# Patient Record
Sex: Female | Born: 1985 | Race: Black or African American | Hispanic: No | Marital: Single
Health system: Midwestern US, Community
[De-identification: ages and names within clinical notes are randomized; demographics above are authoritative.]

## PROBLEM LIST (undated history)

## (undated) DIAGNOSIS — E119 Type 2 diabetes mellitus without complications: Secondary | ICD-10-CM

---

## 2006-07-31 NOTE — ED Provider Notes (Signed)
[facility]                      EMERGENCY DEPARTMENT TREATMENT REPORT   NAME:  [name] [name], [name] [name]         PT. LOCATION:    ER  ER58       DOB:  [birth year]                                                                     AGE:  21   MR #:       BILLING #:           DOS: 07/31/2006  TIME:          SEX:  F   62-76-71    [id number]   cc:    Harless Nakayama, M.D.   Primary Physician:   CHIEF COMPLAINT:  This is a 21 year old female with a history of vaginal   bleeding and low abdominal cramping over the past several days.   HISTORY OF PRESENT ILLNESS:  This is a 21 year old female who states that   she was told last Wednesday that she had triplets and was approximately   [redacted] weeks pregnant.  She came back up here to stay with her mother in this   area and has been having some vaginal bleeding and cramping.  Overall the   symptoms are improving.  The bleeding is resolving, but she came in for   further evaluation.  This is the first time she has been pregnant.  She   states she has not been taking any fertility drugs.  She denies any history   of sexually transmitted disease or known history of prior gynecologic   pathologies or procedures.  No family history of ectopic pregnancy.  She   has not had any dysuria, hematuria, or frequency.  No fever, chills, or   recent change in her bowel movement.  She has had no other trauma.   PAST MEDICAL HISTORY:  Significant for asthma, the gynecologic history as   per History of Present Illness.   PAST SURGICAL HISTORY:  None.   ALLERGIES:  No known drug allergies.   MEDICATIONS:  Prenatal vitamins, Singulair, albuterol.   FAMILY HISTORY:  Noncontributory.   SOCIAL HISTORY:  She is here by herself.  Denies active use of tobacco,   alcohol, or other illegal drugs.   REVIEW OF SYSTEMS:  A complete review of systems is performed and was   negative except for as per History of Present Illness.   PHYSICAL EXAM:    VITAL SIGNS:  Presentation to the emergency department shows a blood   pressure of 134/72, pulse 78, respirations 20, temperature 99.1.   GENERAL:  She is alert, in mild discomfort with occasional abdominal   cramping.  No evidence of severe discomfort or respiratory distress.   HEENT:  Eyes:  Conjunctivae clear, lids normal.  Pupils equal, symmetrical,   and normally reactive.   Ears/Nose:  Hearing is grossly intact to voice.  Internal and external   examinations of the ears and nose are unremarkable.   Mouth/Throat:  Surfaces of the pharynx, palate, and tongue are pink, moist,   and without lesions.   NECK:  Supple, nontender, symmetrical, no masses or JVD, trachea midline.   Thyroid not enlarged, nodular, or tender.   LYMPHS:  No cervical or submandibular lymphadenopathy palpated.   RESPIRATORY:  Clear and equal breath sounds.  No respiratory distress,   tachypnea, or accessory muscle use.   No CVA tenderness.   GI:  She has some mild suprapubic tenderness to palpation.  No tenderness   to percussion.  No other signs of focal peritoneal irritation.  Normoactive   bowel sounds without hepatosplenomegaly without masses.   GU:  Normal external female genitalia.  She has a small amount of blood in   the posterior fornix but no evidence of active bleeding.  No significant   cervical motion tenderness.  She has some diffuse adnexal and uterus   tenderness but no signs of focal peritoneal irritation or masses.   Nails:  No clubbing or deformities.  Nail beds pink with prompt capillary   refill.   SKIN:  Warm and dry without rashes.   LABORATORIES:  Blood type was A-positive.  Serum quantitative HCG was less   than 1.   EMERGENCY DEPARTMENT COURSE:  After admission to the emergency department,   the patient had no evidence of increasing pain, bleeding, or hemodynamic   instability.  She did receive a single dose of 2 mg of Dilaudid and 25 mg   of Phenergan intramuscularly with good improvement of her symptoms here in    the emergency department.  She was emotionally upset with the news that it   appeared she had had a miscarriage, but the ultrasound showed 2 areas of   thickened endometrium consistent with likely sites of miscarriage.  I did   not see any free fluid or other complex adnexal mass on my evaluation.  I   discussed the clinical findings and concerns with the patient as well as   her family members and Dr. Donavan Foil.  Dr. Donavan Foil asked me to rerun the   quantitative HCG to make sure it was truly less than 1. The lab reran the   specimen and stated that it was less than 1.  The patient had no active   bleeding.  Precautions were given prior to discharge.  She was to contact   Dr. Donavan Foil' office tomorrow for followup.   MEDICAL DECISION MAKING:  This is a 21 year old female who presents with   multiple gestations, 1 possible component being ectopic.  At this time, her   quantitative has gone to less than 1.  She does not have signs of active   bleeding or peritoneal irritation.  She remains hemodynamically stable.  I   cannot rule out that one of the pregnancies had been a tubal pregnancy, and   that would match with what is found on ultrasound here, but it appears to   be involuting.  I do not see signs of ruptured tube or internal hemorrhage.   No signs of focal peritoneal irritation.  I cannot rule out an early   process; but, with a quantitative HCG less than 1, it appears safe to have   her follow up with Dr. Donavan Foil who states, at most, he would consider   methotrexate if she was not improving.  The patient felt comfortable with   this plan.  I see no other signs of serious focal bacterial infection or   obstruction, and the patient was improved at the time of discharge.   ASSESSMENT:  Spontaneous miscarriage.   PLAN:  The  patient was discharged to home.  May use Vicodin or Tylenol as   well as ibuprofen as needed for pain.  Follow up with Dr. Donavan Foil in the next    24-48 hours.  Pelvic rest until cleared by Dr. Donavan Foil.  She is to bring her   other documentation from her visit in [place] to that appointment,   but she is to return here immediately for increasing uncontrollable pain,   fever, lightheadedness, increasing bleeding, or any other concerns.   Electronically Signed By:   Denice Paradise, M.D. 08/05/2006 10:47   ____________________________   Denice Paradise, M.D.   My signature above authenticates this document and my orders, the final   diagnosis(es), discharge prescription(s) and instructions in the [facility] record.   ST  D:  07/31/2006  T:  08/03/2006  7:17 A   [id number]

## 2007-03-13 NOTE — ED Provider Notes (Signed)
[name]                      EMERGENCY DEPARTMENT TREATMENT REPORT   NAME:  [name] [name], [name] [name]        PT. LOCATION:      ER  [phone]          DOB:                                                                         AGE:   MR #:      BILLING #:           DOA:  03/13/2007   DOD:              SEX:  F   62-76-71   [id number]   cc:   CHIEF COMPLAINT:  Bad cold.   HISTORY OF PRESENT ILLNESS:  This is a 21 year old female who comes in with   a 2-week history of sinus congestion and productive cough.  She states the   mucus she coughs up is yellowish in nature.  She states that she has not   had a sore throat, but has had a persistent cough and sinus congestion.   She denies chest pain, shortness of breath, or loss of consciousness today.   She denies any dizziness or fevers, but states she has felt achy and   fatigued.   REVIEW OF SYSTEMS:  Cough, achiness, sinus congestion as per HPI.  Denies   complaints in any other system.   PAST MEDICAL HISTORY:  Significant for prior pneumonia this year, asthma.   MEDICATIONS:  Review in IBEX.   ALLERGIES:  None.   FAMILY HISTORY:  Noncontributory.   SOCIAL HISTORY:  Negative.   PHYSICAL EXAMINATION:   VITAL SIGNS:  Blood pressure is 120/70, pulse 74, respirations 16.   Temperature is 97.9.  Pain is rated a 9/10.  O2 saturation is 99% on room   air.   GENERAL:  This is a well-developed, well-nourished, pleasant 21 year old   female.   HEENT:  Head is normocephalic, atraumatic.   Eyes:  Conjunctivae clear, lids normal.  Pupils equal, symmetrical, and   normally reactive.   Ears/Nose:  Hearing is grossly intact to voice.  Internal and external   examinations of the ears and nose are unremarkable.  Posterior pharynx   appears injected without lesions or exudates.  Remainder of palate, tongue,   and oropharynx are pink and moist without lesions.   Nasal mucosa, septum, and turbinates unremarkable.   Teeth and gums unremarkable.    NECK:  Supple, nontender, symmetrical, no masses or JVD, trachea midline.   Thyroid not enlarged, nodular, or tender.   RESPIRATORY:  Clear and equal breath sounds.  No respiratory distress,   tachypnea, or accessory muscle use.   CARDIOVASCULAR:  Heart regular, without murmurs, gallops, rubs, or thrills.   PMI not displaced.   CHEST:  Chest symmetrical without masses or tenderness.   GI:  Abdomen soft, nontender, without complaint of pain to palpation.  No   hepatomegaly or splenomegaly.   BACK:  No CVA tenderness.   SKIN:  Warm and dry without rashes.   INITIAL ASSESSMENT:  Because  of the nature of the patient's symptoms   without having a fever and her well-appearing appearance today, she will be   treated with empiric coverage with doxycycline prescription to go home on   and she was given upper respiratory instructions.   FINAL DIAGNOSIS:  Upper respiratory infection, acute.   DISPOSITION:  She is discharged home with doxycycline.  Use   over-the-counter decongestants, rest, increase p.o. fluids and follow up   with her primary care doctor.  Since she has none, we referred her to   Dr. Dawna Part, who is on-call today, and she is to return if she develops any   worsening symptoms between now and then.   Electronically Signed By:   [name] [name], M.D. 03/17/2007 11:30   ____________________________   [name] [name], M.D.   My signature above authenticates this document and my orders, the final   diagnosis(es), discharge prescription(s) and instructions in the [facility] record.   ST  D:  03/13/2007  T:  03/14/2007  7:22 A   [id number]
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
## 2007-04-01 NOTE — ED Provider Notes (Signed)
[facility]                      EMERGENCY DEPARTMENT TREATMENT REPORT   NAME:  [name] [name], [name] [name]        PT. LOCATION:      ER  [phone]          DOB:                                                                         AGE:   MR #:      BILLING #:           DOA:  04/01/2007   DOD:              SEX:  F   62-76-71   [id number]   cc:   Primary Physician:   CHIEF COMPLAINT:  Abdominal pain.   HISTORY OF PRESENT ILLNESS:  The patient states that she has lower   abdominal discomfort, occasionally sharp in nature, some nausea, but no   vomiting, no diarrhea.  No vaginal discharge prior to today when she   started her menses.  She has had irregular periods.  Not on any birth   control.  The patient says pain started last night.  She denies any other   symptoms.  No urinary symptoms.  No fever.   REVIEW OF SYSTEMS:   CONSTITUTIONAL:  No fever, chills, weight loss.   GENITOURINARY:  As noted above.   CARDIOVASCULAR:  No chest pain, chest pressure, or palpitations.   MUSCULOSKELETAL:  No joint pain or swelling.   INTEGUMENTARY:  No rashes.   MEDICATIONS:  Singulair, albuterol, Advair.   ALLERGIES:  None.   PHYSICAL EXAMINATION:   VITAL SIGNS: Blood pressure 135/76, pulse 69, respirations 16, temperature   96.8.  Pain is 7/10.   GENERAL APPEARANCE:  The patient appears well developed and well nourished.   Appearance and behavior are age and situation appropriate. This is a   well-appearing, 21 year old.   HEENT:  Head is normocephalic and atraumatic.   GASTROINTESTINAL: Abdomen soft and nontender.  Some suprapubic discomfort   on palpation, but no guarding or rebound.   GENITOURINARY:  External genitalia is within normal limits.  On speculum   examination, vaginal walls are normal with pink mucosa.  Cervix is normal   with very minimal amount of bleeding.  Cervix is normal appearing and   closed.  On bimanual examination, no cervical motion tenderness, some    uterine discomfort, very minimal adnexal pain on palpation.  No tenderness.   No masses are felt.  She has a large body habitus.   MUSCULOSKELETAL: Stance and gait appear normal.   SKIN:  Warm and dry without rashes.   IMPRESSION/MANAGEMENT PLAN:  This is a 21 year old female with a fairly   normal examination.  She is slightly uncomfortable to her pelvic area.  She   says it is a cramping occasionally sharp pain that she has had.  Does not   have regular menses and submits she has only had 2 in her life.  She has   not experienced menstrual cramping before.  She does not get regular pap   smears.  She  was instructed to follow up with the Health Department to have   this done.  She will be given GYN for follow-up.  Gonorrhea and chlamydia   cultures are pending as well as wet prep.   DIAGNOSTIC TESTING:  Urine dip is positive for blood, negative nitrites,   positive leukocytes.  She is currently on her menses and pregnancy was   negative.   FINAL DIAGNOSIS:  Dysmenorrhea.   DISPOSITION:  The patient was discharged home, given prescription for   Vicodin.  Told to take Motrin, follow up with gynecologist or Health   Department.  Return here for any worsening or new concerns.   Electronically Signed By:   Thornton Dales, M.D. 04/02/2007 18:48   ____________________________   Thornton Dales, M.D.   My signature above authenticates this document and my orders, the final   diagnosis(es), discharge prescription(s) and instructions in the [facility] record.   ZGA  D:  04/01/2007  T:  04/02/2007  9:30 A   [id number]   ANGELA EADS, PA-C

## 2007-04-01 NOTE — ED Provider Notes (Signed)
[facility]                      EMERGENCY DEPARTMENT TREATMENT REPORT   NAME:  [name] [name], [name] [name]        PT. LOCATION:      ER  [id number]          DOB:                                                                         AGE:   MR #:      BILLING #:           DOA:  04/01/2007   DOD:              SEX:  F   62-76-71   [id number]   cc:   Primary Physician:   CHIEF COMPLAINT:  Abdominal pain.   HISTORY OF PRESENT ILLNESS:  The patient states that she has lower   abdominal discomfort, occasionally sharp in nature, some nausea, but no   vomiting, no diarrhea.  No vaginal discharge prior to today when she   started her menses.  She has had irregular periods.  Not on any birth   control.  The patient says pain started last night.  She denies any other   symptoms.  No urinary symptoms.  No fever.   REVIEW OF SYSTEMS:   CONSTITUTIONAL:  No fever, chills, weight loss.   GENITOURINARY:  As noted above.   CARDIOVASCULAR:  No chest pain, chest pressure, or palpitations.   MUSCULOSKELETAL:  No joint pain or swelling.   INTEGUMENTARY:  No rashes.   MEDICATIONS:  Singulair, albuterol, Advair.   ALLERGIES:  None.   PHYSICAL EXAMINATION:   VITAL SIGNS: Blood pressure 135/76, pulse 69, respirations 16, temperature   96.8.  Pain is 7/10.   GENERAL APPEARANCE:  The patient appears well developed and well nourished.   Appearance and behavior are age and situation appropriate. This is a   well-appearing, 21 year old.   HEENT:  Head is normocephalic and atraumatic.   GASTROINTESTINAL: Abdomen soft and nontender.  Some suprapubic discomfort   on palpation, but no guarding or rebound.   GENITOURINARY:  External genitalia is within normal limits.  On speculum   examination, vaginal walls are normal with pink mucosa.  Cervix is normal   with very minimal amount of bleeding.  Cervix is normal appearing and   closed.  On bimanual examination, no cervical motion tenderness, some    uterine discomfort, very minimal adnexal pain on palpation.  No tenderness.   No masses are felt.  She has a large body habitus.   MUSCULOSKELETAL: Stance and gait appear normal.   SKIN:  Warm and dry without rashes.   IMPRESSION/MANAGEMENT PLAN:  This is a 21 year old female with a fairly   normal examination.  She is slightly uncomfortable to her pelvic area.  She   says it is a cramping occasionally sharp pain that she has had.  Does not   have regular menses and submits she has only had 2 in her life.  She has   not experienced menstrual cramping before.  She does not get regular pap   smears.  She  was instructed to follow up with the Health Department to have   this done.  She will be given GYN for follow-up.  Gonorrhea and chlamydia   cultures are pending as well as wet prep.   DIAGNOSTIC TESTING:  Urine dip is positive for blood, negative nitrites,   positive leukocytes.  She is currently on her menses and pregnancy was   negative.   FINAL DIAGNOSIS:  Dysmenorrhea.   DISPOSITION:  The patient was discharged home, given prescription for   Vicodin.  Told to take Motrin, follow up with gynecologist or Health   Department.  Return here for any worsening or new concerns.   CONTINUATION BY ANGELA EADS, PA-C:   DIAGNOSTIC STUDIES:  Wet prep shows Trichomonas species present.  The   patient was given 2 grams Flagyl p.o. here.  She was also instructed not to   drink any alcohol beverages for the next week.   DIAGNOSIS:  Trichomonas vaginosis.   Electronically Signed By:   Thornton Dales, M.D. 04/03/2007 12:01   ____________________________   Thornton Dales, M.D.   My signature above authenticates this document and my orders, the final   diagnosis(es), discharge prescription(s) and instructions in the [facility] record.   MW  D:  04/01/2007  T:  04/02/2007  8:16 A   [id number]   ANGELA EADS, PA-C

## 2007-05-23 NOTE — ED Provider Notes (Signed)
[facility]                      EMERGENCY DEPARTMENT TREATMENT REPORT   NAME:  [name] [name], [name] [name]          PT. LOCATION:      ER  [phone]          DOB:                                                                         AGE:   MR #:      BILLING #:           DOA:  05/23/2007   DOD:              SEX:  F   [id number]   [id number]   cc:   Primary Physician:  None.   Time of evaluation is 11:05.   CHIEF COMPLAINT:   Back pain.   HISTORY OF PRESENT ILLNESS:  [name] is a 22 year old female.  She denies   any injuries.  States she began having pain in the middle of her lower back   about a week prior to arrival.  Denies abdominal pain, flank pain, dysuria,   fevers or other complaints.   REVIEW OF SYSTEMS:   CONSTITUTIONAL:  No fever, chills, weight loss.   MUSCULOSKELETAL:  As per HPI.   PAST MEDICAL HISTORY:   Significant for asthma.   PAST SURGICAL HISTORY:  No past surgical history.   PSYCHIATRIC HISTORY:  No previous psychiatric history.   SOCIAL HISTORY:  Denies alcohol and drug use.   FAMILY HISTORY:  Noncontributory.   ALLERGIES:  None.   CURRENT MEDICATIONS:  Albuterol, Singulair, Advair.   PHYSICAL EXAMINATION:   VITAL SIGNS:  Blood pressure 131/63, pulse 85, respirations 16, temperature   98.4 and pain 8.   GENERAL APPEARANCE:  The patient appears well developed and well nourished.   Appearance and behavior are age and situation appropriate.   RESPIRATORY:  Clear and equal breath sounds.  No respiratory distress,   tachypnea, or accessory muscle use.   CARDIOVASCULAR:  Heart is regular without murmurs or rubs.   GASTROINTESTINAL:  Abdomen is obese, soft, nondistended, nontender to   palpation.   MUSCULOSKELETAL:  Stance and gait appear normal.   SPINE:  There is no localized cervical, thoracic, lumbar or sacral bony   tenderness to palpation or fist percussion.  There are no bony step-offs,   ecchymoses, areas of soft tissue swelling or deformities.    BACK:  No cva tenderness.   Diagnostic studies included urine dip and urine pregnancy both of which   were negative.  Also, a lumbar spine x-ray as read by radiology as   negative.  Lumbosacral spine:  There are 6 vertebrae of the lumbar type.   The patient was informed of her radiology results.   DIAGNOSIS:  Back pain, lumbar.   DISPOSITION AND PLAN:  She was discharged to home with a prescription for   Flexeril, Motrin and Vicodin #6 tablets.  Also, given follow up with either   [facility] or Dr. Girtha Hake as she does not have a primary care   Velma Hanna.  The patient is  discharged with verbal and written instructions   and a referral for ongoing care.  The patient is aware that they may return   at any time for new or worsening symptoms.   Electronically Signed By:   Stephan Minister, M.D. 06/15/2007 19:53   ____________________________   Stephan Minister, M.D.   My signature above authenticates this document and my orders, the final   diagnosis(es), discharge prescription(s) and instructions in the [facility] record.   SB1  D:  05/23/2007  T:  05/23/2007  4:02 P   [id number]   KARI TOWNS, PA-C

## 2007-10-22 NOTE — ED Provider Notes (Signed)
[facility]                      EMERGENCY DEPARTMENT TREATMENT REPORT   NAME:  ZIYAN, HILLMER        PT. LOCATION:      [id number]          DOB:                                                                         AGE:   MR #:      BILLING #:           DOA:  10/22/2007   DOD:              SEX:  F   [id number]   [id number]   cc:   Primary Physician:  Patient denies   CHIEF COMPLAINT   Shortness of breath and neck pain.   HISTORY OF PRESENT ILLNESS   [age]-year-old female who presents with complaints of cough of   blood-tinged sputum this morning, shortness of breath, as well as neck pain   that radiates from behind the ears bilaterally for the past 2 weeks, but   worsened today.  On triage she states she had a headache.  To myself, she   denies any headache with all of these symptoms.  She is a smoker, smokes   approximately a pack every other day she states.  She denies any fevers or   chills along with this.  She denies any heavy lifting, any trauma to the   neck or back.  The pain, although it is an 8/10, is not so bothersome that   she takes any medication for this.  The pain in the neck seems to be   worsened when she wakes up in the morning, abating throughout the course of   the day.  Once again, she denies any fevers, any weight loss.   REVIEW OF SYSTEMS   CONSTITUTIONAL:  Denies any fever or chills.   ENT:  Denies sore throat, runny nose.   RESPIRATORY:  Short of breath.  No wheezing.   CARDIOVASCULAR:  Denies chest pain.   GI:  Denies nausea, vomiting, diarrhea.   MUSCULOSKELETAL:  As above.   PAST MEDICAL HISTORY   Asthma.   SOCIAL HISTORY   Positive for tobacco use.  Denies alcohol or recreational drug abuse.   CURRENT MEDICATIONS   None.   ALLERGIES   No known drug allergies.   PHYSICAL EXAMINATION   VITAL SIGNS:  Blood pressure 116/67, pulse 84, respirations 12, temperature   97.4, pain 8/10, oxygen saturation 98% on room air.    GENERAL:  This is a well-developed, well-nourished female who appears to be   in no acute distress.  Alert and oriented x 3.  Appearance and behavior are   age and situation appropriate.   HEENT:  Conjunctivae clear.  Lids normal.  Pupils equal, round, react to   light.  Hearing is grossly intact to voice.  Internal and external   examination of the ears and nose is unremarkable.  Surfaces of the pharynx,   palate, and tongue pink, moist, without lesions.   NECK:  Supple.  LYMPHATICS:  There is no cervical or submandibular lymphadenopathy.   RESPIRATORY:  Clear to auscultation.  There are no wheezes, rales, or   rhonchi noted by myself.   CARDIOVASCULAR:  Regular rate and rhythm.  No murmurs, rubs, gallops.   ABDOMEN:  Soft, nondistended, without complaints of pain on palpation.   MUSCULOSKELETAL:  The patient has no localized cervical, thoracic, lumbar,   or sacral bony tenderness, however, she has very reproducible pain when I   rotate her neck to the lateral side.  She is tender bilateral   sternocleidomastoid muscles and especially when I just touch the right   hemitrapezius muscles.  There are no obvious gross deformities, ecchymosis,   or erythema at this point.  Bilateral shoulders have full range of motion.   INITIAL ASSESSMENT AND MANAGEMENT PLAN   [age]-year-old female who presents with shortness of breath and has a   history of asthma, does have productive cough.  Will order a chest x-ray to   rule out any infiltrates.  Will order a D-dimer as she is short of breath,   though she denies chest pain.  Will treat musculoskeletal pain   symptomatically.   CONTINUATION BY ADRIENNE Laural Benes, PA-C   DIAGNOSTIC STUDIES   D-dimer is negative at 0.43, chest shows no acute pulmonary process.   COURSE IN THE EMERGENCY DEPARTMENT   The patient received 30 mg IV ketorolac for pain relief.   FINAL DIAGNOSES   1. Evaluation of dyspnea.   2. Musculoskeletal neck and upper back pain.   DISPOSITION    The patient discharged stable to home.  She had a hand-held nebulizer   treatment that did seem to help increase her air passage bilaterally.  She   will follow at home with an albuterol inhaler, Robaxin and Naprosyn for her   neck pain.  The above patient evaluated by myself and Dr. Lucita Ferrara, who   has referred her to [facility].   Electronically Signed By:   Lucita Ferrara, M.D. 10/26/2007 22:39   ____________________________   Lucita Ferrara, M.D.   Dictated by:  Nelda Bucks, PA-C   My signature above authenticates this document and my orders, the final   diagnosis(es), discharge prescription(s) and instructions in the [facility] record.   CF  D:  10/22/2007  T:  10/23/2007 11:26 P   [id number]
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
## 2008-08-21 NOTE — ED Provider Notes (Signed)
[facility]                      EMERGENCY DEPARTMENT TREATMENT REPORT   NAME:  [name] [name], [name] [name]             SEX:            F   DATE:  08/21/2008                     DOB:            1986/01/29   MR#    [id number]                       TIME SEEN        9:47 A   ACCT#  [id number]                      ROOM:           ER  BM84   cc:   CHIEF COMPLAINT   Sinus pressure and congestion.   HISTORY OF PRESENT ILLNESS   This is a 23 year old female who presents to the emergency room with   complaint of sinus congestion, ear congestion, and scattered cough for the   past 2 weeks.  She denies any fevers.  Denies trying any over-the-counter   medications.  She denies any other complaints.  The patient states the   pressure in her head is getting more increased and she was concerned and   brought herself in today for further evaluation and further treatment.  She   has not sought any medical care for this.   REVIEW OF SYSTEMS   CONSTITUTIONAL:  No fever, chills, weight loss.   EYES: No visual symptoms.   ENT:  Sinus congestion.  Irritation in throat.   RESPIRATORY:  Cough, nonproductive.   CARDIOVASCULAR:  No chest pain, chest pressure, or palpitations.   GASTROINTESTINAL:  No vomiting, diarrhea, or abdominal pain.   MUSCULOSKELETAL:  No joint pain or swelling.   PAST MEDICAL HISTORY   History of asthma.   SOCIAL HISTORY   Smokes cigarettes.  Denies alcohol abuse.   ALLERGIES   No known drug allergies.   PHYSICAL EXAMINATION   VITAL SIGNS:  Blood pressure 122/68, pulse 88, respirations 20, temperature   97.3, O2 saturation 99% on room air, and pain 8/10.   GENERAL APPEARANCE:  The patient appears well developed and well nourished.   Appearance and behavior are age and situation appropriate.   HEENT:  Eyes:  Conjunctivae clear, lids normal.  Pupils equal, symmetrical,   and normally reactive.  Ears/Nose: Sinuses are boggy.  The patient's    bilateral eardrums show to have fluid buildup behind them, some yellowness,   ear canal is otherwise unremarkable.  No tenderness from manipulation of   the ears.  The patient does show some tenderness over the ethmoid sinuses.   No tenderness over the maxillary frontal.   Mouth/Throat:  Uvula is   central.  Tonsils are not enlarged.  No postnasal drip is noted at this   time.   LYMPHATICS:  No cervical or submandibular lymphadenopathy palpated.   RESPIRATORY:  Clear and equal breath sounds.  No respiratory distress,   tachypnea, or accessory muscle use.   INITIAL ASSESSMENT/MANAGEMENT PLAN   This is a 23 year old female who presents to the emergency room with   concern of symptoms she is having,  concern of pneumonia versus sinuses,   versus allergies.  Examination does not support pneumonia, her lung sounds   were clear, afebrile, and examination was unlikely and did not support   history wise of more pulmonary and more of sinus sources starting.   Examination shows more of a sinusitis based symptoms.  We will go ahead and   treat as such and have followup with referral physicians.   EMERGENCY DEPARTMENT COURSE   The patient remained stable and comfortable throughout the emergency room   visit.  Dr. Dixie Dials reevaluated the patient, did discus with the   patient about a Neti pot, Flonase, Sudafed, and Mucinex and followup.   CLINICAL IMPRESSION/DIAGNOSIS   Sinusitis.   DISPOSITION/PLAN   The patient is discharged home in stable condition, with instructions to   follow up with their regular doctor.  They are advised to return   immediately for any worsening or symptoms of concern.  The patient was   personally evaluated by myself and Dr. Dixie Dials who agrees with the   above assessment and plan.   Electronically Signed By:   Dixie Dials, M.D. 08/23/2008 07:29   ____________________________   Dixie Dials, M.D.   Dictated By:  Dorian Heckle, PA    My signature above authenticates this document and my orders, the final   diagnosis(es), discharge prescription(s) and instructions in the [facility] record.   TW  D:  08/21/2008  T:  08/22/2008 10:31 A   [id number]

## 2008-11-30 NOTE — ED Provider Notes (Signed)
[facility]                      EMERGENCY DEPARTMENT TREATMENT REPORT   NAME:  [name] [name]               SEX:            F   DATE:  11/30/2008                     DOB:            08-Sep-1985   MR#    [id number]                       TIME SEEN        2:33 P   ACCT#  [id number]                      ROOM:           ER  ZO10   cc:   TIME OF EVALUATION   1322.   CHIEF COMPLAINT   Toothache.   HISTORY OF PRESENT ILLNESS   This is a 23 year old female presenting to the emergency room with pain on   both left wisdom teeth which has been going on for the last 2 weeks.  The   patient states worsening pain in both of her wisdom teeth on the left side   of her teeth.  She denies any swelling noted.  She has been taking Tylenol   for the last few days, which provides very little relief of her symptoms.   Otherwise she denies any pain elsewhere.  She states that she has an   appointment with her dentist that she does not know the name of the next   month.  However, despite her taking Tylenol, she has had worsening pain.   REVIEW OF SYSTEMS   CONSTITUTIONAL:  No fever, chills, weight loss.   ENT:  See HPI.   Denies complaints in any other system.   PAST MEDICAL HISTORY   Asthma.   SOCIAL HISTORY   The patient is here alone.  She denies tobacco, alcohol or illicit drug use   or abuse.   MEDICATIONS   Tylenol.   ALLERGIES   None.   PHYSICAL EXAMINATION   VITAL SIGNS:  Blood pressure 122/71, pulse 84, respirations 16, temperature   99.9 orally.  Pain is 2/10.   CONSTITUTIONAL:  A well-developed, well-nourished obese 23 year old black   female in no acute pain distress.   PSYCHIATRIC:  Oriented to time, place and person.  Mood and affect   appropriate. Judgment appears appropriate.   HEENT:  Mouth/Throat:  Surfaces of the pharynx, palate, and tongue are   pink, moist, and without lesions.   Teeth good dentition noted.  Gums were unremarkable.  No tooth fractures    noted.  Tooth pain elicited when palpating the left maxillary and   mandibular wisdom teeth.   NECK:  Supple, nontender, symmetrical, no masses or JVD, trachea midline.   Thyroid not enlarged, nodular, or tender.   LYMPHATICS: No cervical or submandibular lymphadenopathy palpated.   RESPIRATORY:  Clear and equal breath sounds.  No respiratory distress,   tachypnea, or accessory muscle use.   CARDIOVASCULAR:  Heart regular, without murmurs, gallops, rubs, or thrills.   PMI not displaced.   CHEST:  Chest symmetrical without masses or tenderness.   GI:  Abdomen  soft, nontender, without complaint of pain to palpation.  No   hepatomegaly or splenomegaly. No abdominal or inguinal masses appreciated   by inspection or palpation.   INITIAL ASSESSMENT   The patient has toothache in both her upper and lower left side wisdom   teeth.  We will give her prescription of penicillin for toothache.  We will   give her a prescription of Naprosyn for pain.  She was instructed that   narcotic analgesic medications will not be given here in the emergency room   for her toothache.   FINAL DIAGNOSIS   Left facial pain secondary to toothache.   DISPOSITION AND PLAN   Prescription penicillin for toothache.  Prescription of Naprosyn and   Tylenol for pain as needed.  She is to follow up with her dentist with   appointment next month.  She was given a referral to Dr. Lynnea Ferrier and Algis Greenhouse   on-call dentist for further management.  She was also given name of Dr.   Estrellita Ludwig for follow up and if unsuccessful with the dentist follow up.   The patient was personally evaluated by myself and Dr. Carmela Hurt who   agrees with the above assessment and plan.   ____________________________   Dixie Dials, M.D.   Dictated By:  Sheran Lawless, PA   My signature above authenticates this document and my orders, the final   diagnosis(es), discharge prescription(s) and instructions in the [facility] record.    lc2  D:  11/30/2008  T:  12/01/2008  1:34 P   [id number]

## 2008-12-26 NOTE — ED Provider Notes (Signed)
[facility]                      EMERGENCY DEPARTMENT TREATMENT REPORT   PRELIMINARY (DRAFT) -- FINAL REPORT  in HPF   NAME:  [name] [name], [name] [name]               SEX:            F   DATE:  12/26/2008                     DOB:            [birth year]   MR#    [id number]                       TIME SEEN        9:38 A   ACCT#  [id number]                      ROOM:           ER  ER71   cc:   CHIEF COMPLAINT   Swollen ankle and right foot pain.   HISTORY OF PRESENT ILLNESS   This is a 23 year old female who was at work, went into a freezer and   inadvertently twisted her right ankle causing pain to the ankle and foot.   She denies any other injury.  She denies any other alleviating or   aggravating factors associated with her condition.   REVIEW OF SYSTEMS   MUSCULOSKELETAL:  Right ankle and foot pain and swelling.   INTEGUMENTARY:  No rashes.   PAST MEDICAL HISTORY   Asthma.   SOCIAL HISTORY   Denies alcohol, tobacco and drug use.   CURRENT MEDICATIONS   Listed and reviewed in [facility].   ALLERGIES   No known drug allergies.   PHYSICAL EXAMINATION   VITAL SIGNS:  Blood pressure is 105/57, pulse 90, respirations 20,   temperature is 98.2, O2 saturations 99% on room air, pain is 8/10.   GENERAL APPEARANCE:  The patient appears well developed and well nourished.   Appearance and behavior are age and situation appropriate.   RESPIRATORY:  Clear and equal breath sounds.  No respiratory distress,   tachypnea, or accessory muscle use.   CARDIOVASCULAR:  Heart regular, without murmurs, gallops, rubs, or thrills.   PMI not displaced.   MUSCULOSKELETAL:  The patient has excellent range of motion of her right   hip, knee.  She has minimal range of motion to her right ankle due to the   pain.  Sensation is intact.  DP pulses are 2+.  The patient has pain to   palpation over the entire ankle and dorsum of the foot.  There is minimal   edema.   SKIN:  Warm and dry without rashes.    INITIAL ASSESSMENT/MANAGEMENT PLAN   This is a 23 year old female who comes in with a right ankle and foot   injury.  Will obtain an x-ray to verify the absence or presence of fracture   or dislocation.   DIAGNOSTIC STUDIES/RESULTS   Ankle complete and foot complete as read by Dr. Dixie Dials as negative   for fracture or dislocation.   CLINICAL COURSE   During the patient's stay in the emergency room, she did not develop any   new or worsening symptoms, she remained stable.  She is placed in an air   splint and given  crutches with instructions.  She is to follow up with her   primary care.   CLINICAL IMPRESSION/DIAGNOSIS   Ankle sprain.   DISPOSITION/PLAN   The patient is discharged home in stable condition.  Discharge instructions   on ankle sprain with air splint.  She is to follow up with primary care.   Referral for Dr. Bethanne Ginger was given.  Return to the emergency room if   condition worsens or new symptoms develop.  Do not drive or operate   machinery while medicated.  Ice and elevate.  Advance activity as   tolerated.  A prescription for ibuprofen and Vicodin were given.  The   patient was personally evaluated by myself and Dr. Dixie Dials who   agrees with the above assessment and plan.   ____________________________   Dixie Dials, M.D.   Dictated By:  Alva Garnet, PA   My signature above authenticates this document and my orders, the final   diagnosis(es), discharge prescription(s) and instructions in the [facility] record.   lc2  D:  12/26/2008  T:  12/27/2008  7:03 P   [id number]

## 2009-03-17 NOTE — ED Provider Notes (Signed)
[facility]                      EMERGENCY DEPARTMENT TREATMENT REPORT           PRELIMINARY (DRAFT) -- FINAL REPORT  in HPF   NAME:  [name] [name]             SEX:            F   DATE:  03/17/2009                     DOB:            [birth year]   MR#    [id number]                       TIME SEEN        9:04 P   ACCT#  [id number]                      ROOM:           ER  ER30       cc:       Primary care physician:  Unknown       CHIEF COMPLAINT   Dental pain.       HISTORY OF PRESENT ILLNESS   A 23 year old female who comes with a 2-day history of right lower tooth   pain.  She denies any fevers, vomiting, drooling, or difficulty obtaining   her mouth.  She denies any other alleviating or aggravating factors   associated with her condition.       REVIEW OF SYSTEMS   CONSTITUTIONAL:  No fever, chills, weight loss.   EYES: No visual symptoms.   ENT:  Denies sore throat, nasal congestion.  Admits to tooth pain.   GASTROINTESTINAL:  No vomiting, diarrhea, or abdominal pain.   NEUROLOGICAL:  No headaches, sensory or motor symptoms.       PAST MEDICAL HISTORY   None.       CURRENT MEDICATIONS   None.       ALLERGIES   NO KNOWN DRUG ALLERGIES.       SOCIAL HISTORY   Denies alcohol, tobacco, and drug use.       PHYSICAL EXAMINATION   VITAL SIGNS: Blood pressure is 120/67, pulse 87, respirations 14,   temperature 96.7, and pain is 9/10.   HEENT:  Eyes:  Conjunctivae clear, lids normal.  Pupils equal, symmetrical,   and normally reactive. Ears/Nose:  Hearing is grossly intact to voice.   Internal and external examinations of the ears and nose are unremarkable.   Mouth/Throat:  Surfaces of the pharynx, palate, and tongue are pink, moist,   and without lesions. Teeth:  The patient demonstrates pain in the area of   her right wisdom teeth.  The gums appear to be unremarkable.  There is no   erythema, edema, or fluctuance.  The patient is not demonstrating drooling    or trismus.  The floor of her mouth appears to be soft.   NECK:  Supple, nontender, symmetrical, no masses or JVD, trachea midline.   Thyroid not enlarged, nodular, or tender. No cervical or submandibular   lymphadenopathy palpated.   RESPIRATORY:  Clear and equal breath sounds.  No respiratory distress,   tachypnea, or accessory muscle use.   CARDIOVASCULAR:  Heart regular, without murmurs, gallops, rubs, or thrills.   PMI not  displaced.       INITIAL ASSESSMENT/MANAGEMENT PLAN   This is a 23 year old female who comes in with dental pain.  We will give   her a list of dental clinics in a dentist to followup with.  We will give   her a prescription for penicillin and Motrin in the even that she has   developed an infection that is not obvious.  The patient does not   demonstrate signs or symptoms that are suggestive of a deep space   infection.  There is no trismus noted.  The floor of the mouth is soft.   She is not drooling.  Her gums are unremarkable.       CLINICAL IMPRESSION/DIAGNOSIS   Dental pain.       DISPOSITION/PLAN   The patient is discharged home in stable condition with discharge   instructions on dental pain.  She is to followup with dentist, a list of   dental clinics have been given to her.  She is return to the ER if   condition worsens or new symptoms develop.  She is to encourage fluids.  A   prescription for ibuprofen and penicillin was given.  The patient was   personally evaluated by myself and Dr. Stormy Card who agrees with the   above assessment and plan.                                               ____________________________   Stormy Card, M.D.       Dictated By:  Alva Garnet, PA       My signature above authenticates this document and my orders, the final   diagnosis(es), discharge prescription(s) and instructions in the [facility] record.       tw  D:  03/17/2009  T:  03/18/2009  1:00 P   [id number]

## 2009-04-20 NOTE — ED Provider Notes (Signed)
[facility]                      EMERGENCY DEPARTMENT TREATMENT REPORT           PRELIMINARY (DRAFT) -- FINAL REPORT  in HPF   NAME:  [name], [name]             SEX:            F   DATE:  04/20/2009                     DOB:            1985/11/30   MR#    [id number]                       TIME SEEN        6:52 P   ACCT#  [id number]                      ROOM:           ER  ZO10       cc:       CHIEF COMPLAINT  Toothache.       HISTORY A 24 year old female with a complaint of toothache.  She states it   started today. It is on her right lower.  She believes it is her wisdom   tooth. She denied any drainage. Denied any fever or chills. No other   complaints.       REVIEW OF SYSTEMS   CONSTITUTIONAL:  No fever, chills, weight loss.   ENT: Positive for dental pain, right lower wisdom.       PAST MEDICAL HISTORY None.       MEDICATIONS None.       ALLERGIES   NONE.       SOCIAL HISTORY   Noncontributory. Last menstrual period is currently.       PHYSICAL EXAMINATION This is a well-developed female.   VITAL SIGNS: Blood pressure 123/72, Pulse 96, Respirations 18, Temperature   97.8, O2 saturation  99% on room air.   HEAD AND FACE: There is no facial swelling. Opens here mouth widely. Teeth   in fair repair. Her right lower wisdom tooth appears intact.  The gingiva   is tender. It is not particularly inflamed. There is no drainage. No ulcers   on the floor of the mouth.  The throat is clear.   EARS:  TMs are clear bilaterally.   NECK: Supple, symmetrical. No swelling.   LUNGS: Clear to auscultation.   HEART: Regular rate and rhythm.       IMPRESSION AND MANAGEMENT PLANThis 24 year old female presents for   evaluation of dental pain, right lower molar.  I did discuss that she will   need dental follow-up, not much in the way of treatment from an emergency   standpoint at this time.  Discussed using ibuprofen.  It looks like she was    here in December for dental pain. Certainly, return if worsening or new   concerns. Given the names of Drs. Lynnea Ferrier and Effort, on call. Prescription   for Pen-Vee K.       FINAL DIAGNOSIS Evaluation, acute right lower dental pain--wisdom tooth   impaction.       DISPOSITION AND PLANThe patient was discharged home in stable condition.   Follow-up as above.  The patient was examined by myself  and Dr. Arvella Merles who   agrees with above assessment and plan.                                   ____________________________   Wetzel Bjornstad. Arvella Merles, M.D.       Dictated By:  Fara Chute, PA-C       My signature above authenticates this document and my orders, the final   diagnosis(es), discharge prescription(s) and instructions in the [facility] record.       pb  D:  04/20/2009  T:  04/22/2009  4:31 A   [id number]

## 2009-08-10 NOTE — ED Provider Notes (Signed)
[facility]   EMERGENCY DEPARTMENT TREATMENT REPORT   NAME:  [name] [name]   SEX:   F   ADMIT: 08/10/2009   DOB:   Oct 31, 1985   MR#    [id number]   ROOM:     TIME SEEN: 01 14 AM   ACCT#  [id number]               CHIEF COMPLAINT:   Foot pain.       HISTORY OF PRESENT ILLNESS:   This is a 24 year old female with bilateral foot pain for the last 2 weeks.     It hurts when she stands on it, particularly in the morning when she gets up.     She stands on her feet quite a bit during the day.  She works at [employer].  No    known injury or trauma.  She states they only hurt when she stands.  She    denies any calf pain or knee pain.       REVIEW OF SYSTEMS:   CONSTITUTIONAL:  No fever, chills, or weight loss.    MUSCULOSKELETAL:  Positive for bilateral foot pain, kind of the bottoms of her    feet with standing only.   INTEGUMENTARY:  No rash.   NEUROLOGICAL:  No headaches, sensory or motor symptoms.         PAST MEDICAL HISTORY:   Denies any chronic medical problems.       MEDICATIONS:   None.       ALLERGIES:   NONE.       SOCIAL HISTORY:   Does stand on her feet all day.  Last menstrual period she states she thinks    it was within the last month or 2.  Denies any chance of being pregnant.       PHYSICAL EXAMINATION:   GENERAL:  This is a well-developed female.   VITAL SIGNS:  Blood pressure 120/64, pulse 92, respirations 17, temperature    98.1, and O2 sats 98% on room air.   EXTREMITIES:  Examination of bilateral lower extremities, the right foot, no    obvious acute bony or soft tissue abnormalities.  I cannot elicit any    tenderness.  She points to the plantar aspect over the arch that is reproduced    with standing there.  She has good pedal pulses, good cap refill.  There is    no soft tissue swelling.  Achilles is intact and nontender.  Calcaneus is    nontender.  Distal sensation intact.  She has good cap refill.  Calf soft and    nontender.  Knee is nontender.  Left lower extremity is warm and dry  with    good pedal pulses and good cap refill.  No obvious deformity.  She has good    pedal pulses.  Sensation intact.  Good cap refill.  No tenderness over the    plantar aspect or the calcaneus, although that is where she localizes her    pain.  Achilles intact, nontender.  Calf nontender.  Knee is nontender.  I    should also note that  her feet  has diminished arches bilaterally.       IMPRESSION AND MANAGEMENT PLAN:   This is a 24 year old female who presents for evaluation of bilateral foot    pain over the plantar aspect with standing that has just gotten progressively    worse.  It is unclear when exactly her last period is,  but she does not think    she is pregnant.  At this time, an  urine pregnancy will be obtained and we    will get x-rays of her bilateral feet.        CONTINUATION BY JULIA HUBBARD, PA-C:        DIAGNOSTIC STUDIES:   X-rays of bilateral feet showed no acute fracture or bony super per ED    attending.       COURSE IN THE EMERGENCY DEPARTMENT:   Findings were discussed.  Discussed followup.  Her feet appear very flat.  It    think this is contributing to part of her symptoms.  She states she is    wearing a good shoe with good support.  We will refer to podiatry, given a    couple of podiatrist on staff.  To use ibuprofen and given a note for work.     She understands to certainly return anytime worsening or new concerns.       FINAL DIAGNOSIS:   Evaluation of acute bilateral foot pain - planar fasciitis.       DISPOSITION AND PLAN:   The patient was discharged home in stable, follow up above.  The patient was    examined by myself and by Dr. Vinnie Langton who agrees with the above assessment    and plan.           ___________________   Smitty Cords MD   Dictated By: Maurice Small. [place], [place]   tc   D:08/11/2009   T: 08/12/2009 09:34:21   [id number]

## 2009-11-28 NOTE — ED Provider Notes (Signed)
KNOWN ALLERGIES     Nkda       TRIAGE   PATIENT: NAME: [name] [name], AGE: 24, GENDER: female, DOB:         Sat 1985/09/13, TIME OF GREET: Fri Nov 28, 2009 16:24, SSN:         [id number], KG WEIGHT: 117.9, HEIGHT: 154cm, MEDICAL RECORD NUMBER:         [id number], ACCOUNT NUMBER: [id number], PCP: Pt Decline,.   ADMISSION: URGENCY: 4, DEPT: Emergency, BED: WAITING.   VITAL SIGNS: BP 140/95, (Sitting), Pulse 89, Resp 16, Temp 98.4,         (Oral), Pain 10, O2 Sat 99, on Room air, Time 11/28/2009 16:49.   COMPLAINT:  Spained RIGHT Foot.   PRESENTING COMPLAINT:  Hurt right ankle after fall.   PAIN: Patient complains of pain, On a scale 0-10 patient rates         pain as 10.     Triage assessment performed.   TB SCREENING: TB screen not applicable for this patient.   ABUSE SCREENING: Patient denies physical abuse or threats.   FALL RISK: Patient has High risk of falling, History of falling,         Secondary diagnosis, Crutches/cane/walker, No IV or IV Access, Weak,         Oriented to own ability, Total Morse Fall Scale Score is: 75.   SUICIDAL IDEATION: Suicidal ideation is not present.   ADVANCE DIRECTIVES: Patient does not have advance directives.   PROVIDERS: TRIAGE NURSE: Lubertha South, RN.   PREVIOUS VISIT ALLERGIES: Nkda.       CURRENT MEDICATIONS   Patient not taking meds       INSTRUCTION   DISCHARGE:  ANKLE SPRAIN - AIRSPLINT (SPRAIN, TWISTED ANKLE),         FOOT SPRAIN - WITH ACE AS NEEDED (FOOT INJURY).   FOLLOWUP:  [facility], [id number],         [place] [address], [phone].   SPECIAL:  -elevate/ice to foot/ankle         -gradually increase weight bearing next 3-4 days; if not much better         7 days you need to be rechecked         -motrin/advil         Return to the ER if condition worsens or new symptoms develop.   Key:     [email or web address]=Lopienski, RN, Consuella Lose  JLD0=Delnero, RN, Victorino Dike  JOHO=Hubbard,     PA-C, Amil Amen

## 2009-11-28 NOTE — ED Provider Notes (Signed)
[facility]   EMERGENCY DEPARTMENT TREATMENT REPORT   NAME:  [name] [name]   SEX:   F   ADMIT: 11/28/2009   DOB:   1985-10-29   MR#    [id number]   ROOM:     TIME SEEN: 08 08 PM   ACCT#  [id number]               CHIEF COMPLAINT:   Injured right foot and ankle.       HISTORY OF PRESENT ILLNESS:   This is a 24 year old female who was playing around with her children and    missed 2 steps coming down landing on her right foot and ankle.  This occurred    about 1500 hours this afternoon and presents for evaluation of pain and    swelling in her right foot and ankle.  No head injury, no loss of    consciousness.  Denies any neck, back or knee pain, no hip pain.       REVIEW OF SYSTEMS:   She is able to walk on it, although it is painful.       REVIEW OF SYSTEMS:   CONSTITUTIONAL:  No fever, chills, weight loss.    CARDIOVASCULAR:  No chest pain, chest pressure, or palpitations.    GASTROINTESTINAL:  No vomiting, diarrhea, or abdominal pain.    RESPIRATORY:  No cough, shortness of breath, or wheezing.    MUSCULOSKELETAL:  Positive for right foot and ankle pain.  Denies any knee,    hip or back pain.   NEUROLOGICAL:  No headaches, sensory or motor symptoms.       PAST MEDICAL HISTORY:   Denies.  Last menstrual period started 3 days ago.       MEDICATIONS:   None.       ALLERGIES:   NONE.       SOCIAL HISTORY:   Noncontributory.       PHYSICAL EXAMINATION:   GENERAL:  This is a well-developed female.   VITAL SIGNS:  Blood pressure was 140/95, pulse 79, respirations 16,    temperature 98.4, O2 saturation is 99% on room air.   HEENT:  Head normocephalic, atraumatic.  Eyes:  Conjunctivae clear, lids    normal.  Pupils equal, symmetrical, and normally reactive.   Mouth/Throat:     Surfaces of the pharynx, palate, and tongue are pink, moist, and without    lesions.      NECK:  Supple, nontender, symmetrical, no masses or JVD, trachea midline.     Thyroid not enlarged, nodular, or tender.     LUNGS:  Clear to auscultation.   HEART:  Regular rate and rhythm.   ABDOMEN:  Soft, nontender.   MUSCULOSKELETAL:  Back nontender.  Hips/Pelvis:  Hips and pelvis intact and    nontender. Right Lower Extremity:  The knee is nontender, calf nontender.     Ankle:  She has a moderate amount of soft tissue swelling over the lateral and    medial malleoli with tenderness in this area with generalized tenderness to    palpation of the dorsum of the foot.  She has good pedal pulses and good    capillary refill.  Sensation intact.  She is able to wiggle her toes.     Achilles is intact and nontender.  Calcaneus is nontender.  Skin is intact.  I    cannot appreciate any abrasions or lacerations.  The left lower extremity is    atraumatic.  IMPRESSION AND MANAGEMENT PLAN:   This is a 24 year old female with injury to her right foot and ankle.  We will    obtain  x-rays to evaluate for acute bony injury.       DIAGNOSTIC STUDIES:   X-ray of the right foot and ankle read by radiology as negative for fracture.       COURSE IN EMERGENCY DEPARTMENT:   Findings were discussed.  We will place an air splint on crutches, ice and    elevation,   Motrin or Advil.  Prescription for Vicodin was given.  Gradually    increase weightbearing over the next 3 to 4 days.  If not better in 7 days,    she needs to be rechecked.  Given orthopedist on call, Dr. Penelope Coop.  She    understands to certainly seek medical attention at any time for worsening or    new concerns.       FINAL DIAGNOSIS:   Acute right ankle and foot sprain.       DISPOSITION AND PLAN:   The patient was discharged home in stable condition to follow up as above.     The patient was examined by myself and Dr. Damien Fusi, who agrees with the    above assessment and plan.           ___________________   Elsie Saas MD   Dictated By: Maurice Small. [place], [place]   mw   D:11/28/2009   T: 11/29/2009 10:54:04   [id number]

## 2009-12-22 NOTE — ED Provider Notes (Signed)
KNOWN ALLERGIES     Nkda       TRIAGE   PATIENT: NAME: [name] [name], AGE: 24, GENDER: female, DOB:         Sat 1985-10-26, TIME OF GREET: Mon Dec 22, 2009 [place], [place]:         [id number], MEDICAL RECORD NUMBER: [id number], ACCOUNT NUMBER: [id number],         PCP: Doctor Rene Paci,.   ADMISSION: URGENCY: 4, DEPT: Emergency, BED: WAITING.   VITAL SIGNS: BP 151/77, Pulse 88, Resp 20, Temp 97.1, Pain 10, O2         Sat 98, on Room air, Time 12/22/2009 18:00.   COMPLAINT:  Fell Down Stair Stiff Back.   PROVIDERS: TRIAGE NURSE: Bynum Bellows, RN, CEN.   PREVIOUS VISIT ALLERGIES: Nkda.       CURRENT MEDICATIONS     No recorded medications   Key:     [id number]=Shoemaker, RN, CEN, Misty Stanley

## 2010-01-08 NOTE — ED Provider Notes (Signed)
KNOWN ALLERGIES   NKDA       TRIAGE   TRIAGE NOTES:  pt sts she was playing with her kids when she         slipped down the steps. pt c/o lower back pain.   PATIENT: NAME: [name] [name], AGE: 24, GENDER: female, DOB:         Sat [birth year], TIME OF GREET: Thu Jan 08, 2010 20:51, [place]:         [id number], MEDICAL RECORD NUMBER: [id number], ACCOUNT NUMBER: [id number],         PCP: Doctor Rene Paci,.   ADMISSION: URGENCY: 4, DEPT: Emergency, BED: WAITING.   VITAL SIGNS: BP 150/88, (Sitting), Pulse 95, Resp 14, Temp 98.5,         (Oral), Pain 9, O2 Sat 99, on Room air, Time 01/08/2010 21:04.   COMPLAINT:  Back Pain.   PRESENTING COMPLAINT:  Back Pain.   TB SCREENING: TB screen negative for this patient.   ABUSE SCREENING: Patient denies physical abuse or threats.   FALL RISK: Patient has a low risk of falling.   SUICIDAL IDEATION: Suicidal ideation is not present.   ADVANCE DIRECTIVES: Patient does not have advance directives,         Triage assessment performed.   PROVIDERS: TRIAGE NURSE: Clemon Chambers, RN.       CURRENT MEDICATIONS   Patient not taking meds       INSTRUCTION   DISCHARGE:  LUMBOSACRAL STRAIN - WITHOUT X-RAYS (BACK STRAIN, LOW         BACK PAIN).   FOLLOWUPBlenda Bridegroom, GMD, [address], [place]         [place] [address], [phone].   SPECIAL:  Ultram and Robaxin for pain and muscle relaxant         properties, respectively. Do not drive or operate machinery while on         these medications.         Naprosyn for pain/anti-inflammatory properties - do not take with         NSAIDS.         Do not take the above medications if you are pregnant.         Read and understand discharge instructions prior to leaving ER.         Follow these in regards to care and return for those reasons as         detailed.         Return to the ER if condition worsens or new symptoms develop.         Follow up with primary care physician.   Key:      [email or web address]=Rivera, RN, Shanda Bumps  NJB=Brosky, PA-C, [employer], RN,     Judeth Cornfield

## 2010-01-08 NOTE — ED Provider Notes (Signed)
[facility]   EMERGENCY DEPARTMENT TREATMENT REPORT   NAME:  [name] [name]   SEX:   F   ADMIT: 01/08/2010   DOB:   [birth year]   MR#    [id number]   ROOM:     TIME SEEN: 09 41 PM   ACCT#  [id number]               PRIMARY CARE PHYSICIAN:   The patient denies.       EVALUATION TIME:   2121       CHIEF COMPLAINT:   Back pain.       HISTORY OF PRESENT ILLNESS:   A 24 year old female who states that approximately 2 hours ago and she slipped    and fell down 3 steps injuring her low back.  She denies any blunt injury    and/or trauma to affected back.  She denies any radicular pain to said low    back injury.  No loss of bowel or bladder control, no saddle anesthesia.  No    head injury, no loss of consciousness.  No further injury or trauma seeking    further evaluation in the ER at this time.       REVIEW OF SYSTEMS:   MUSCULOSKELETAL:  As above.  No other joint pain or swelling.   INTEGUMENTARY:  No rashes.    NEUROLOGICAL:  No sensory or motor symptoms.       PAST MEDICAL HISTORY:   None.       SOCIAL HISTORY:   No illicit drug use.       FAMILY HISTORY:   Noncontributory.       ALLERGIES:   NONE.       MEDICATIONS:   None.       PHYSICAL EXAMINATION:   GENERAL APPEARANCE:  The patient appears well developed and well nourished.     Appearance and behavior are age and situation appropriate.      VITAL SIGNS:  Blood pressure 150/88, pulse 75, respirations 14, temperature    99.5, O2 saturation 99% on room air.     SPINE:  There is no localized cervical, thoracic, lumbar or sacral bony    tenderness to palpation or fist percussion.  There are no bony step-offs,    ecchymoses, areas of soft tissue swelling or deformities. Equally tender    bilateral paralumbosacral region of the back with no further paraspinal    tenderness on exam.  Hips and pelvis are stable and nontender.  Moving all    four extremities without difficulty without pain.   NEUROLOGIC:  Light touch sensation intact in equal dermatomes of the  bilateral    lower extremities 5 out of 5 strength bilateral lower extremities.  Deep    tendon reflexes are diminished though symmetric in bilateral lower    extremities.   SKIN:  Warm and dry.       CONTINUATION BY NICHOLAS BROSKY, PA-C:        INITIAL ASSESSMENT AND MANAGEMENT PLAN:    A 24 -year-old female presenting with paraspinal pain consistent with muscular    strain and spasm, no midline focal pain, no deficits.  No clinical    correlation requiring diagnostic imaging studies in the ER at this time.  Will    discharge home with supportive care measures.       CLINICAL IMPRESSION AND DIAGNOSIS:   Acute lumbosacral strain       DISPOSITION:    The patient was  personally evaluated by myself and Dr. Carmela Hurt who agrees    with the above assessment and plan.  Ultram times 10 as needed for pain,    Robaxin times 14 for muscle relaxant properties, Naprosyn for pain and    anti-inflammatory properties.  Supportive care measures per discharge    instructions.  Follow up with his physician.  Return here sooner if condition    worsens, new symptoms develop or for any concerns.           ___________________   Christiana Pellant MD   Dictated By: Baruch Goldmann, PA-C   ml   D:01/08/2010   T: 01/09/2010 11:31:46   [id number]

## 2010-01-27 NOTE — ED Provider Notes (Signed)
KNOWN ALLERGIES   NKDA       TRIAGE   TRIAGE NOTES:  PATIENT COMPLAINS OF RECTAL BLEEDING AND SWELLING         AROUND ANUS X 3 DAYS. PATIENT STATES UNABLE TO HAVE BM DUE TO         PAIN.PATIENT STATES SHE USED PREPERATION H AND FFELS IT MADE THE PAIN         WORSE.   PATIENT: NAME: [name] [name], AGE: 24, GENDER: female, DOB:         Sat July 24, 1985, TIME OF GREET: Tue Jan 27, 2010 23:47, SSN:         [id number], KG WEIGHT: 131.5 (est.), MEDICAL RECORD NUMBER: [id number],         ACCOUNT NUMBER: [id number], PCP: Doctor Rene Paci,.   ADMISSION: URGENCY: 3, DEPT: Emergency, BED: WAITING.   VITAL SIGNS: BP 134/89, (Sitting), Pulse 93, Resp 20, Temp 97.6,         (Oral), Pain 10, O2 Sat 98, on Room air, Time 01/27/2010 23:58.   COMPLAINT:  Rectal Bleed/Swollen/Unable Re.   TB SCREENING: TB screen negative for this patient.   ABUSE SCREENING: Patient denies physical abuse or threats.   FALL RISK: Patient has a low risk of falling.   SUICIDAL IDEATION: Not Applicable.   ADVANCE DIRECTIVES: Unknown if patient has advance directives,         Triage assessment performed.   PROVIDERS: TRIAGE NURSE: Adele Dan, RN.   PREVIOUS VISIT ALLERGIES: Nkda.       CURRENT MEDICATIONS   Patient not taking meds       MEDICATION SERVICE   Colace:  Order: Colace (Docusate Sodium) - Dose: 100 mg         : Oral         Ordered by: Sharma Covert, PA-C         Entered by: Sharma Covert, PA-C Wed Jan 28, 2010 01:49 ,          Acknowledged by: Randalyn Rhea Rn [facility]) Wed Jan 28, 2010 02:37         Documented as given by: Randalyn Rhea Rn [facility]) Wed Jan 28, 2010         02:40          Patient, Medication, Dose, Route and Time verified prior to         administration.          Time given: 0240, Amount given: 100 mg, Site: Medication         administered P.O., Correct patient, time, route, dose and medication         confirmed prior to administration, Patient advised of actions and          side-effects prior to administration, Allergies confirmed and         medications reviewed prior to administration, Patient in position of         comfort, Side rails up, Cart in lowest position, Family at bedside,         Call light in reach.       INSTRUCTION   DISCHARGE:  HEMORRHOIDS - WITHOUT I AND D (PILES, THROMBOSED         HEMORRHOID).   FOLLOWUPErnst Spell, GMD, [address], [place]         [address], [phone].   SPECIAL:  Follow up with primary care physician, Dr. Girtha Hake as  needed.         Drink plenty of fluids.         Return to the ER if condition worsens or new symptoms develop.   Key:     KMJ=Jones, PA-C, [place]  LMT1=Tschai, RN, Sheliah Mends

## 2010-01-27 NOTE — ED Provider Notes (Signed)
[facility]   EMERGENCY DEPARTMENT TREATMENT REPORT   NAME:  [name] [name], [name] [name]   SEX:   F   ADMIT: 01/27/2010   DOB:   Sep 08, 1985   MR#    [id number]   ROOM:     TIME SEEN: 01 50 AM   ACCT#  [id number]               PRIMARY CARE PHYSICIAN:   None       TIME OF EVALUATION:   0105       CHIEF COMPLAINT:   "Rectum bleeding and swollen, can't go to bathroom."       HISTORY OF PRESENT ILLNESS:   This is a 24 year old female who comes in complaining of a 2 to 3-day history    of rectal bleeding and pain.  She states that her stool has been hard to pass    and she had some bright red blood when she wiped.  She has had no prior    episodes of hemorrhoids.  She tried preparation-H, but states she thinks it    made it worse.  Her last bowel movement was today and it was very soft.         REVIEW OF SYSTEMS:   CONSTITUTIONAL:  No fever or chills.   RESPIRATORY:  No cough, shortness of breath, or wheezing.    CARDIOVASCULAR:  No chest pain, chest pressure, or palpitations.    GASTROINTESTINAL:  No vomiting, diarrhea, or abdominal pain.  Positive for    rectal pain.   GENITOURINARY:  No dysuria, frequency, or urgency.     MUSCULOSKELETAL:  No joint pain or swelling.     INTEGUMENTARY:  No rashes.    NEUROLOGICAL:  No headache.       PAST MEDICAL HISTORY:   Asthma.       MEDICATIONS:   None.       ALLERGIES:   NONE.       SOCIAL HISTORY:   Nonsmoker.       FAMILY HISTORY:   Unrelated.       PHYSICAL EXAMINATION:   VITAL SIGNS:  Blood pressure 134/89, pulse 93, respiratory rate 20,    temperature 97.6, O2 saturation is 98% on room air, pain is a 10 out of 10.   GENERAL APPEARANCE:  This is a well-developed, well-nourished, obese female.     Her appearance and behavior are age and situation appropriate.   HEENT:  Eyes:  Conjunctivae clear, lids normal.  Pupils equal, symmetrical,    and normally reactive.    LYMPHATICS: No cervical or submandibular lymphadenopathy palpated.      RESPIRATORY:  Clear and equal breath sounds.  No respiratory distress,    tachypnea, or accessory muscle use.      CARDIOVASCULAR:  Heart:  Regular rate and rhythm.    GI:  Abdomen soft, nontender, without complaints of pain to palpation.  No    hepatomegaly or splenomegaly.  Rectal:  There are external hemorrhoids    present.  No internal hemorrhoids were present on exam.  Sphincter tone is    normal.  There is no bleeding.  There is no evidence of thrombosis in the    hemorrhoids.  Stool brown, guaiac negative.     SKIN:  Warm and dry without rashes.    PSYCHIATRIC:  Judgment appears appropriate. Oriented to time, place and    person.  Mood and affect appropriate.         INITIAL ASSESSMENT  AND MANAGEMENT PLAN:   This is a 24 year old female who comes in with hemorrhoids, they are not    thrombosed.  At this time, we will treat her as such.       FINAL DIAGNOSIS:   External hemorrhoids.       DISPOSITION AND PLAN:   The patient is discharged home in stable condition.  She is given a    prescription for Anusol and soft softener.  Instructed and given written    instructions on the treatment of hemorrhoids.  The patient is also advised    that she can take Tylenol or Advil as needed for pain.  All the patient's    questions were answered.  The patient was personally evaluated by myself and    Dr. Dixie Dials who agrees with the above assessment and plan.           ___________________   Christiana Pellant MD   Dictated By: Sheppard Penton, PA-C   tc   D:01/28/2010   T: 01/28/2010 14:30:52   [id number]

## 2010-02-03 NOTE — ED Provider Notes (Signed)
[facility]   EMERGENCY DEPARTMENT TREATMENT REPORT   NAME:  [name] [name]   SEX:   F   ADMIT: 02/03/2010   DOB:   05-05-1985   MR#    [id number]   ROOM:     TIME SEEN: 03 41 PM   ACCT#  [id number]               PRIMARY CARE PHYSICIAN:   None       TIME OF EVALUATION:   1420       CHIEF COMPLAINT:   Rectal bleeding and abdominal pain.       HISTORY OF PRESENT ILLNESS:   This is a 24 year old female who presents for evaluation of lower abdominal    pain and rectal bleeding.  The patient states she has been experiencing sharp    lower abdominal pain for the past 2 days.  The pain has been constant.  She    states she feels like she "needs to use the bathroom."  She also reports    rectal bleeding since 01/28/2010, describing it as bright red blood per    rectum.  States she sees blood dripping into the toilet. The patient presented    here to [facility] ED on 10/19 for evaluation of rectal bleeding and pain.     She was found to have nonthrombosed external hemorrhoids.  Given prescription    for Anusol and stool softener and advised to follow up with Dr. Girtha Hake.  The    patient states that since that time, she has continued to feel constipated and    has been using prescribed medications and reports no improvement in symptoms.     She has not followed up with Dr. Girtha Hake due to financial concerns.  Patient    has also been using Preparation H, Tucks and Epsom salt soaks and denies any    improvement in her symptoms.       REVIEW OF SYSTEMS:   CONSTITUTIONAL:  No fever, chills, or weight loss.     RESPIRATORY:  No cough, shortness of breath, or wheezing.     CARDIOVASCULAR:  No chest pain, chest pressure, or palpitations.     GASTROINTESTINAL: Abdominal pain, rectal bleeding, constipation.   GENITOURINARY:  No dysuria, frequency, or urgency.     MUSCULOSKELETAL:  No joint pain or swelling.        PAST MEDICAL HISTORY:   External hemorrhoids, asthma.       FAMILY HISTORY:   Noncontributory.        SOCIAL HISTORY:   Nonsmoker.       MEDICATIONS:   Anusol, stool softener.       ALLERGIES:   NONE.       PHYSICAL EXAMINATION:   VITAL SIGNS:  Blood pressure 126/76, pulse 81, respirations 20, temperature    97.4, O2 sats not recorded.   GENERAL APPEARANCE:  The patient is obese and has a very pleasant body odor.   RESPIRATORY:  Lungs are clear to auscultation bilaterally.  No wheezes, rales    or rhonchi.   CARDIOVASCULAR:  Heart regular rate and rhythm, no murmurs, rubs or gallops.   GI:  Normoactive bowel sounds in all four quadrants.  Abdomen is soft,    nondistended, nontender, without complaint of pain to palpation in any    quadrants.  No organomegaly noted.   RECTAL:  Exam reveals nonthrombosed external hemorrhoids.  Sphincter tone is    normal.  Stool brown, guaiac negative.  MUSCULOSKELETAL:  Stance and gait appear normal.     SKIN:  Warm and dry without rashes.         INITIAL ASSESSMENT AND MANAGEMENT PLAN:   This is a 24 year old female presenting for reevaluation of rectal pain and    bleeding.  I discussed with her the importance of following up with primary    care physician for further management.  She stated she was unable to do so due    to financial reasons.  I explained to her that the physician on call that she    was referred to, could see her 1 time after her emergency room visit and then    any additional visits, patient is financially responsible for. After this    conversation and prior to MD evaluation, the patient eloped from the Emergency    Department.       CLINICAL IMPRESSION AND DIAGNOSIS.   Evaluation of abdominal pain and rectal bleeding.       DISPOSITION AND PLAN:   The patient eloped prior to MD evaluation.  The patient was personally    evaluated by myself and Dr. Truddie Crumble who agrees with the above assessment and    plan.           ___________________   Posey Pronto MD   Dictated By: Zachary George. Pernell Dupre, PA-C   ak   D:02/03/2010   T: 02/03/2010 16:10:96   [id number]

## 2010-02-03 NOTE — ED Provider Notes (Signed)
KNOWN ALLERGIES   NKDA       TRIAGE   PATIENT: NAME: [name] [name], AGE: 24, GENDER: female, DOB:         Sat Mar 01, 1986, TIME OF GREET: Tue Feb 03, 2010 13:39, SSN:         [id number], KG WEIGHT: 136.1 (est.), MEDICAL RECORD NUMBER: [id number],         ACCOUNT NUMBER: [id number], PCP: Pt Denies,.   ADMISSION: URGENCY: 3, DEPT: Emergency, BED: WAITING.   VITAL SIGNS: BP 126/76, (Sitting), Pulse 81, Resp 20, Temp 97.4,         (Oral), Pain 10, Time 02/03/2010 13:51.   COMPLAINT:  Stomach Pain Rectum Bleed.   PRESENTING COMPLAINT:  abdomen pain, hemorrhoids, constipation         was here on the 19th for the same.   PAIN: Patient complains of pain, On a scale 0-10 patient rates         pain as 10.   LMP: Last menstrual period: 01/13/2010.   TB SCREENING: TB screen not applicable for this patient.   ABUSE SCREENING: Patient denies physical abuse or threats.   FALL RISK: Patient has a low risk of falling.   SUICIDAL IDEATION: Suicidal ideation is not present.   ADVANCE DIRECTIVES: Patient does not have advance directives,         Triage assessment performed.   PROVIDERS: TRIAGE NURSE: Annamary Carolin, RN.   PREVIOUS VISIT ALLERGIES: Nkda.       CURRENT MEDICATIONS   Preparation H   Anusol-HC:  1 Rectal 2 times a day. x 25 mg - Rectal - BID.   Dulcolax Stool Softener:  1 tab(s) Oral once a day. x Sodium 100         Mg - Oral - QDAY.   Key:     LDD0=David, RN, Lyn

## 2010-05-08 NOTE — ED Provider Notes (Signed)
[facility]   EMERGENCY DEPARTMENT TREATMENT REPORT   NAME:  [name] [name]   SEX:   F   ADMIT: 05/08/2010   DOB:   03/07/1986   MR#    [id number]   ROOM:     TIME SEEN: 10 49 AM   ACCT#  [id number]               PRIMARY CARE PHYSICIAN:   None.       ER EVALUATION TIME:   0940        CHIEF COMPLAINT:   Flu-like symptoms.       HISTORY OF PRESENT ILLNESS:   A 25 year old female comes in for evaluation of not feeling well for the last    2 days.  She says she had the abrupt onset of sore throat, body aches, nasal    congestion and cough, as well as a frontal headache without neck stiffness.     She has not had vomiting or diarrhea.       REVIEW OF SYSTEMS:   CONSTITUTIONAL:  Has not had fever or chill.   EYES:  No visual symptoms.     ENT:  Congestion, sore throat.   RESPIRATORY:  Cough.   CARDIOVASCULAR:  No chest pain, chest pressure, or palpitations.     GASTROINTESTINAL:  No vomiting, diarrhea, or abdominal pain.  MUSCULOSKELETAL:     Myalgia.   INTEGUMENTARY:  No rashes.   NEUROLOGIC:  Frontal headache, no neck stiffness.       PAST MEDICAL HISTORY:   No chronic medical problems.       SOCIAL HISTORY:   The patient is a nonsmoker, is here alone.       FAMILY HISTORY:   Not known.       MEDICATIONS:   None.       ALLERGIES:   NONE.       PHYSICAL EXAMINATION:   GENERAL APPEARANCE:  The patient appears well developed and well nourished.      Appearance and behavior are age and situation appropriate.     VITAL SIGNS:  Blood pressure 131/78, pulse 76, respirations 16, temperature    98.4, 97% on room air, 8 out of 10 pain.   EYES:  Conjunctivae clear, lids normal.  Pupils equal, symmetrical, and    normally reactive.     ENT:  Hearing is grossly intact to voice.  Internal and external examinations    of the ears and nose are unremarkable.  Oropharynx reveals erythema to the    throat with mild exudate.  There is no trismus, no drooling.  Uvula is midline    and nonedematous.    NECK:  Supple, no adenopathy.   RESPIRATORY:  Clear and equal breath sounds.  No respiratory distress,    tachypnea, or accessory muscle use.     HEART:  Regular, without significant murmurs, gallops, rubs, or thrills.  PMI    not displaced.     GI: Abdomen is soft, nontender.   SKIN:  No rash.   NEUROLOGIC:  The patient is awake, alert, ambulatory, normal gait, nontoxic in    appearance with a supple neck present.       CONTINUATION BY ERIN ICENBICE, PA:       INITIAL ASSESSMENT:   A patient with upper respiratory symptoms, myalgias, afebrile, nontoxic in    appearance.  She does have an erythematous throat and we will check a strep    screen, but likely this  is a viral syndrome.  I will medicate for symptoms.       EMERGENCY DEPARTMENT COURSE:   The patient was given Motrin and Tylenol plus p.o. fluids.  Strep screen was    negative.       CLINICAL IMPRESSION AND DIAGNOSES:   1.  Rhinitis and pharyngitis.   2.  Cough and myalgias.   3.  Viral syndrome.       DISPOSITION AND PLAN:    Home.  The patient is given a prescription for Cheratussin AC.  Advised Motrin    and Tylenol at home , plenty of fluids and follow up with primary care doctor    if not improving.  Return here acutely for worrisome symptoms or concerns.     The patient was personally evaluated by myself and Dr. Dixie Dials who    agrees with the above assessment and plan.  The patient is discharged home in    stable condition with instructions to follow up with their regular doctor.     They are advised to return immediately for any worsening or symptoms of    concern.           ___________________   Christiana Pellant MD   Dictated By: Marlana Salvage, PA   ec   D:05/08/2010   T: 05/08/2010 11:02:56   [id number]

## 2010-05-08 NOTE — ED Provider Notes (Signed)
KNOWN ALLERGIES   NKDA       TRIAGE   TRIAGE NOTES:  c/o sore throat with body aches x 2 days.   PATIENT: NAME: [name] [name], AGE: 25, GENDER: female, DOB:         Sat 10/29/1985, TIME OF GREET: Fri May 08, 2010 09:28, SSN:         [id number], KG WEIGHT: 136.1, HEIGHT: 157cm, MEDICAL RECORD NUMBER:         [phone], ACCOUNT NUMBER: [id number], PCP: Pt Denies,.   ADMISSION: URGENCY: 4, DEPT: Emergency, BED: *QCC 06.   VITAL SIGNS: BP 131/78, (Sitting), Pulse 76, Resp 16, Temp 98.4,         (Oral), Pain 8, O2 Sat 97, on Room air, Time 05/08/2010 09:30.   COMPLAINT:  Flu-Like Sxs.   CONFIRM: Patient states no change in past medical history.   PRESENTING COMPLAINT:  c/o sore throat with body aches x 2 days.   PAIN: Patient complains of pain, Pain described as aching, Pain         described as burning, On a scale 0-10 patient rates pain as 8,         throat, body aches, Pain is intermittent, Onset was x 2 days, No         modifying factors.   LMP: Last menstrual period: hx irreg menses - states I can't         remember when asked when last menses was, Pt not on birth control.   TB SCREENING: TB screen not applicable for this patient.   ABUSE SCREENING: Patient denies physical abuse or threats.   FALL RISK: Patient has a low risk of falling, Patient has no         history of falling (0), Secondary diagnosis (25), None/bed rest/nurse         assist (0), No IV or IV access (0), Normal/bed rest/wheelchair (0),         Oriented to own ability (0), Total 25.   SUICIDAL IDEATION: Not Applicable.   ADVANCE DIRECTIVES: Patient does not have advance directives,         Triage assessment performed.   PROVIDERS: TRIAGE NURSE: Theodis Sato, RN.   PREVIOUS VISIT ALLERGIES: Nkda.       CURRENT MEDICATIONS   Patient not taking meds       MEDICATION SERVICE   Acetaminophen:  Order: Acetaminophen - Dose: 975 mg :         Oral         Ordered by: Marlana Salvage, PA-C         Entered by: Marlana Salvage, PA-C Fri May 08, 2010 09:43 ,           Acknowledged by: Rachael Darby, RN Fri May 08, 2010 09:44         Documented as given by: Rachael Darby, RN Fri May 08, 2010 09:52          Patient, Medication, Dose, Route and Time verified prior to         administration.          Time given: 0950, Amount given: 975 gms, Site: Medication         administered P.O., Correct patient, time, route, dose and medication         confirmed prior to administration, Patient advised of actions and         side-effects prior to administration, Allergies confirmed and  medications reviewed prior to administration, Patient tolerated         procedure well, Administered by Bonney Leitz RN BSN, Cart in lowest         position.   Ibuprofen:  Order: Ibuprofen - Dose: 800 mg : Oral         Ordered by: Marlana Salvage, PA-C         Entered by: Marlana Salvage, PA-C Fri May 08, 2010 09:43 ,          Acknowledged by: Rachael Darby, RN Fri May 08, 2010 09:44         Documented as given by: Rachael Darby, RN Caleen Essex May 08, 2010 09:53          Patient, Medication, Dose, Route and Time verified prior to         administration.          Time given: 0950, Amount given: 800 mgs, Site: Medication         administered P.O., Correct patient, time, route, dose and medication         confirmed prior to administration, Patient advised of actions and         side-effects prior to administration, Allergies confirmed and         medications reviewed prior to administration, Patient tolerated         procedure well, Administered by Bonney Leitz RN BSN, Cart in lowest         position.       INSTRUCTION   DISCHARGE:  UPPER RESPIRATORY INFECTION (VIRAL URI, URI,         BRONCHITIS), PHARYNGITIS, VIRAL (THROAT INFECTION, SORE THROAT).   Elenor QuinonesRayetta Humphrey, MEDICINE, [address],         [place] [address], [phone].   SPECIAL:  Plenty of fluids and rest. Tylenol 650mg  every 6 hours         for pain. Ibuprofen 800mg  (4 over the counter tablets) every 8 hours          for pain.          Follow up with primary care physician         Return to the ER if condition worsens or new symptoms develop.   Key:     EI=Icenbice, PA-C, Erin  TJM0=Miller, RN, [employer]

## 2010-05-08 NOTE — ED Provider Notes (Signed)
KNOWN ALLERGIES   NKDA       TRIAGE Caleen Essex May 08, 2010 09:33 TJM0)   TRIAGE NOTES:  c/o sore throat with body aches x 2 days. Caleen Essex May 08, 2010 09:33 TJM0)   PATIENT: NAME: [name] [name], AGE: 25, GENDER: female, DOB:         Sat Oct 26, 1985, TIME OF GREET: Fri May 08, 2010 09:28, SSN:         [id number], KG WEIGHT: 136.1, HEIGHT: 157cm, MEDICAL RECORD NUMBER:         [id number], ACCOUNT NUMBER: [id number], PCP: Pt Denies,. Caleen Essex May 08, 2010         09:33 TJM0)   ADMISSION: URGENCY: 4, DEPT: Emergency, BED: R[facility]ing 06. Caleen Essex May 08, 2010 09:33 TJM0)   VITAL SIGNS: BP 131/78, (Sitting), Pulse 76, Resp 16, Temp 98.4,         (Oral), Pain 8, O2 Sat 97, on Room air, Time 05/08/2010 09:30. (09:30         TJM0)   COMPLAINT:  Flu-Like Sxs. (Fri May 08, 2010 09:33 TJM0)   CONFIRM: Patient states no change in past medical history. (09:35         TJM0)   PRESENTING COMPLAINT:  c/o sore throat with body aches x 2 days.         (09:35 TJM0)   PAIN: Patient complains of pain, Pain described as aching, Pain         described as burning, On a scale 0-10 patient rates pain as 8,         throat, body aches, Pain is intermittent, Onset was x 2 days, No         modifying factors. (09:35 TJM0)   LMP: Last menstrual period: hx irreg menses - states I can't         remember when asked when last menses was, Pt not on birth control.         (09:35 TJM0)   TB SCREENING: TB screen not applicable for this patient. (09:35         TJM0)   ABUSE SCREENING: Patient denies physical abuse or threats. (09:35         TJM0)   FALL RISK: Patient has a low risk of falling, Patient has no         history of falling (0), Secondary diagnosis (25), None/bed rest/nurse         assist (0), No IV or IV access (0), Normal/bed rest/wheelchair (0),         Oriented to own ability (0), Total 25. (09:35 TJM0)   SUICIDAL IDEATION: Not Applicable. (09:35 TJM0)   ADVANCE DIRECTIVES: Patient does not have advance directives,          Triage assessment performed. (09:35 TJM0)   PROVIDERS: TRIAGE NURSE: Theodis Sato, RN. Caleen Essex May 08, 2010         09:33 TJM0)   PREVIOUS VISIT ALLERGIES: Nkda. Caleen Essex May 08, 2010 09:33         TJM0)       CURRENT MEDICATIONS (09:33 TJM0)   Patient not taking meds       MEDICATION SERVICE   Acetaminophen:  Order: Acetaminophen - Dose: 975 mg :         Oral         Ordered by: Marlana Salvage, PA-C  Entered by: Marlana Salvage, PA-C Fri May 08, 2010 09:43 ,          Acknowledged by: Rachael Darby, RN Caleen Essex May 08, 2010 09:44         Documented as given by: Rachael Darby, RN Caleen Essex May 08, 2010 09:52          Patient, Medication, Dose, Route and Time verified prior to         administration.          Time given: 0950, Amount given: 975 gms, Site: Medication         administered P.O., Correct patient, time, route, dose and medication         confirmed prior to administration, Patient advised of actions and         side-effects prior to administration, Allergies confirmed and         medications reviewed prior to administration, Patient tolerated         procedure well, Administered by Bonney Leitz RN BSN, Cart in lowest         position.   Ibuprofen:  Order: Ibuprofen - Dose: 800 mg : Oral         Ordered by: Marlana Salvage, PA-C         Entered by: Marlana Salvage, PA-C Fri May 08, 2010 09:43 ,          Acknowledged by: Rachael Darby, RN Fri May 08, 2010 09:44         Documented as given by: Rachael Darby, RN Caleen Essex May 08, 2010 09:53          Patient, Medication, Dose, Route and Time verified prior to         administration.          Time given: 0950, Amount given: 800 mgs, Site: Medication         administered P.O., Correct patient, time, route, dose and medication         confirmed prior to administration, Patient advised of actions and         side-effects prior to administration, Allergies confirmed and         medications reviewed prior to administration, Patient tolerated          procedure well, Administered by Bonney Leitz RN BSN, Cart in lowest         position.       ORDERS (09:43 EI)   PO Fluids as tolerated:  Ordered for: Carmela Hurt, MD, Ben         Status: Done by Enis Slipper, RN, Dillard Essex May 08, 2010 09:44.   STREP SCREEN-GROUP A (THROAT):  Ordered for: Carmela Hurt, MD, Ben         Status: Done by System Caleen Essex May 08, 2010 10:14.       NURSING ASSESSMENT: ENT (09:54 RQO0)   CONSTITUTIONAL: History obtained from patient, Patient arrives         ambulatory, Gait steady, Patient cooperative, Patient alert, Oriented         to person, place and time, Skin normal in color, Mucous membranes         pink, Mucous membranes moist.   PAIN: aching pain, to the throat, constant, on a scale 0-10         patient rates pain as 8, Pt also complains of body aches with her         sorethroat and on and off fever.   ENT: Unable to swallow, complains of  difficulty swallowing,         Associated with fever, Associated with headache.   RESPIRATORY/CHEST: Respiratory assessment findings include         respiratory effort easy, Respirations regular, Conversing normally,         Associated with cough, productive of.   SAFETY: Cart/Stretcher in lowest position.       NURSING PROCEDURE: DISCHARGE NOTE (10:58 RQO0)   TIME:  Patient discharged to, home, Patient, ambulates without         assistance, Transported via friend/family driving, Accompanied by         family member, Discharge instructions given to, patient,         Simple/moderate discharge teaching performed, Above Person(s)         verbalized understanding of discharge instructions and follow-up         care.       NURSING PROCEDURE: PO CHALLENGE (10:25 RQO0)   PATIENT IDENTIFIER: Patient's identity verified by patient         stating name, Patient's identity verified by patient stating birth         date, Patient's identity verified by hospital ID bracelet, Patient         actively involved in identification process.    PO CHALLENGE: Oral challenge performed, patient given water,         amount (mL) 120 ml.   FOLLOW-UP: After procedure, patient tolerated oral challenge.       DIAGNOSIS (10:33 EI)   FINAL: PRIMARY: rhinitis and pharyngitis, ADDITIONAL: Cough,         myalgaias, Viral syndrome.       DISPOSITION   PATIENT:  Disposition Type: Discharged, Disposition: Discharged,         Condition: Stable. (10:33 EI)      IV Infusion: N/A, Patient left the department. (10:58 RQO0)       INSTRUCTION (10:32 EI)   DISCHARGE:  UPPER RESPIRATORY INFECTION (VIRAL URI, URI,         BRONCHITIS), PHARYNGITIS, VIRAL (THROAT INFECTION, SORE THROAT).   Elenor QuinonesRayetta Humphrey, MEDICINE, [address],         [place] [address], [phone].   SPECIAL:  Plenty of fluids and rest. Tylenol 650mg  every 6 hours         for pain. Ibuprofen 800mg  (4 over the counter tablets) every 8 hours         for pain.          Follow up with primary care physician         Return to the ER if condition worsens or new symptoms develop.   Key:     EI=Icenbice, PA-C, Erin  RQO0=Obcemane, RN, [employer], RN,     [employer]

## 2010-12-11 NOTE — ED Provider Notes (Signed)
KNOWN ALLERGIES   NKDA       TRIAGE Caleen Essex Dec 11, 2010 06:56 LMT1)   TRIAGE NOTES:  PATIENT COMPLAINS OF HEAVY VAGINAL BLEEDING AND         CRAMPING. PATIENT STATES MENSTURAL CYCLE IS HEAVIER THAN NORMAL.         PATIENT ALSO COMPLAINS OF KNOT TO LEFT BREAST. Caleen Essex Dec 11, 2010         06:56 LMT1)   PATIENT: NAME: [name] [name], AGE: 25, GENDER: female, DOB:         Sat [birth year], TIME OF GREET: Fri Dec 11, 2010 06:49, LANGUAGE:         [place], [place]: [id number], KG WEIGHT: 131.5 (est.), HEIGHT: 157cm,         MEDICAL RECORD NUMBER: [id number], ACCOUNT NUMBER: [id number], PCP: Pt         Denied,. (Fri Dec 11, 2010 06:56 LMT1)   ADMISSION: URGENCY: 3, DEPT: Emergency, BED: WAITING. Caleen Essex Dec 11, 2010 06:56 LMT1)   VITAL SIGNS: BP 114/60, (Sitting), Pulse 73, Resp 18, Temp 96.8,         (Oral), Pain 7, O2 Sat 99, on Room air, Time 12/11/2010 06:53. (06:53         LMT1)   COMPLAINT:  Heavy Bleeding,Cramping,Knot. Caleen Essex Dec 11, 2010 06:56         LMT1)   LMP: Last menstrual period: 12-07-2010. (06:57 LMT1)   TREATMENT PRIOR TO ARRIVAL: Medication: ADVIL @0200 . (06:57         LMT1)   TB SCREENING: TB screen negative for this patient. (06:57         LMT1)   ABUSE SCREENING: Patient denies physical abuse or threats. (06:57         LMT1)   FALL RISK: Patient has a low risk of falling. (06:57 LMT1)   SUICIDAL IDEATION: Suicidal ideation is not present. (06:57         LMT1)   ADVANCE DIRECTIVES: Unknown if patient has advance directives,         Triage assessment performed. (06:57 LMT1)   PROVIDERS: TRIAGE NURSE: Adele Dan, RN. Caleen Essex Dec 11, 2010         06:56 LMT1)   PREVIOUS VISIT ALLERGIES: Nkda. Caleen Essex Dec 11, 2010 06:56         LMT1)       CURRENT MEDICATIONS (06:56 LMT1)   Patient not taking meds       ORDERS   Pelvic Exam Setup:  Ordered for: Wenda Overland, DO, Jennifer         Status: Done by Dot Been, Gaetana Michaelis Dec 11, 2010 07:49. (07:09         [facility])   Urine HCG:  Ordered for: Tamala Bari          Status: Done by Junita Push, RN, Ricarda Frame Dec 11, 2010 08:16. (07:09         JOHO)   WET PREP:  Ordered for: Wenda Overland, DO, Jennifer         Status: Done by System Fri Dec 11, 2010 08:22. (07:15 JOHO)   STD SCREEN:  Ordered for: Wenda Overland, DO, Jennifer         Status: Active. (07:15 JOHO)   ED Bedside H/H:  Ordered for: Tamala Bari         Status: Done by Junita Push RN, Ricarda Frame Dec 11, 2010 08:16. (  07:16         JOHO)   PELVIC/ANOSCOPY EXAM SUPPLIES:  Ordered for: Himmel Salch, DO,         Jennifer         Status: Active. (08:30 KSR0)       NURSING ASSESSMENT: GENITOURINARY (07:04 DLC3)   CONSTITUTIONAL: Complex assessment performed, History obtained         from patient, Patient arrives ambulatory, Gait steady, Patient         appears comfortable, Patient cooperative, Patient alert, Oriented to         person, place and time, Skin warm, Skin dry, Skin normal in color,         Mucous membranes pink, Mucous membranes moist, Patient is         well-groomed.   PAIN FEMALE: cramping pain, throbbing pain, to the suprapubic         region, Pain radiates, to the back, Onset of pain 6 days, constant,         on a scale 0-10 patient rates pain as 7, Pain exacerbated by nothing.   GENITOURINARY FEMALE: Associated with urinary complaints ,         retention, urgency, Associated with vaginal bleeding , large amount,         with clots, five pads saturated, in one hour.   ABDOMEN: Abdomen assessment findings include abdomen symmetrical,         Abdomen soft, tender, suprapubic, no pulsatile mass, Bowel sound         normal, Associated with nausea.       NURSING ASSESSMENT: SKIN (07:06 DLC3)   PAIN: aching pain, under L breast, area of         swelling/tendernessredness measuring approximately 2 cm x 2 cm.       NURSING PROCEDURE: DISCHARGE NOTE (08:29 KSR0)   DISCHARGE: Patient discharged to home, ambulating without         assistance, family driving, accompanied by other family member,          Discharge instructions given to patient, IV discontinued at n/a,         Simple or moderate discharge teaching performed, Above person(s)         verbalized understanding of discharge instructions and follow-up         care.       DIAGNOSIS (07:15 JOHO)   FINAL: PRIMARY: eval vaginal bleeding; DUB.       DISPOSITION   PATIENT:  Disposition Type: Discharged, Disposition: Discharged,         Condition: Stable. (08:12 JOHO)      IV Infusion: N/A, Patient left the department. (08:30 KSR0)       INSTRUCTION (08:15 JOHO)   DISCHARGE:  DYSFUNCTIONAL UTERINE BLEEDING (DUB, VAGINAL         BLEEDING), FOLLICULITIS - WITHOUT ANTIBIOTICS (HAIR FOLLICLE         INFECTION).   FOLLOWUPAnda Kraft, OBG, [address], [place]         [address], [phone].   SPECIAL:  -rest         -increase fluid intake         -use small amount bacitracin oint to area on left breast         -follow-up w/ gyn for further eval of vaginal bleeding         Return to the ER if condition worsens or new symptoms develop.   Key:  DLC3=Catalano, RN,BSN, Lupita Leash  JOHO=Hubbard, PA-C, Amil Amen  KSR0=Rodgers,     RN, Iantha Fallen     LMT1=Tschai, RN, Sheliah Mends

## 2010-12-11 NOTE — ED Provider Notes (Signed)
[facility]   EMERGENCY DEPARTMENT TREATMENT REPORT   NAME:  [name] [name]   SEX:   F   ADMIT: 12/11/2010   DOB:   21-Apr-1985   MR#    [id number]   ROOM:     TIME SEEN: 07 50 AM   ACCT#  [id number]               TIME OF EVALUATION:    0710       CHIEF COMPLAINT:   Vaginal bleeding.       HISTORY OF PRESENT ILLNESS:   This is a 25 year old female with history of irregular menses.  States she    only has a menstrual cycle every 3 to 4 months lasting several months at a    time.  This time her vaginal bleeding started 4 days ago, has been heavy.  She    also is complaining of a knot on her left breast for a couple days as well.     Denies any chance of pregnancy.  States she had to once put on birth control    pills to try to regulate her cycle but states that she was afraid to take    them because she would gain weight.       REVIEW OF SYSTEMS:   CONSTITUTIONAL:  No fever.   ENT:  No sore throat, runny nose, or other URI symptoms.    RESPIRATORY:  No cough, shortness of breath, or wheezing.    CARDIOVASCULAR:  No chest pain, chest pressure, or palpitations.    GASTROINTESTINAL:  No vomiting, diarrhea, or abdominal pain.    GENITOURINARY:  Positive for vaginal bleeding.   MUSCULOSKELETAL:  Denies any leg pain or swelling.   NEUROLOGICAL:  No headaches, sensory or motor symptoms.       PAST MEDICAL HISTORY:   History of what sounds like dysfunctional uterine bleeding.       MEDICATIONS:   Nothing on a regular basis.       ALLERGIES:   NONE KNOWN.       SOCIAL HISTORY:   Positive tobacco, smoking cessation strongly encouraged.  No history of    travel.       PHYSICAL EXAMINATION:   GENERAL:  Well-developed female.   VITAL SIGNS:  Blood pressure 114/60, pulse 73, respirations 18, temperature    96.8, O2 sats 99% on room air.   HEENT:  Mucous membranes pink.   NECK:  Supple, nontender, symmetrical, no masses or JVD, trachea midline,    thyroid not enlarged, nodular, or tender.     LUNGS:  Clear to auscultation.   HEART:  Regular rate and rhythm.   ABDOMEN:  Completely soft, nontender.     BREASTS:  Examination her left breast reveals a small area of redness more of    a follicular irritation on the undersurface the left breast measuring may be    2 mm.  It is not fluctuant.  There is no crepitus.  The remainder of the    breast is intact and nontender.  There is no nipple inversion, no nipple    discharge, no axillary lymphadenopathy.   PELVIC EXAM:  There is some dark red blood in the vault.  The os is closed.     The cervix is nontender.  Uterus is nontender.  Adnexa nontender.  No masses    or fullness.   EXTREMITIES:  Warm and dry.        CONTINUATION BY JULIA  C. 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 At this time hemoglobin and hematocrit will be    obtained given heavy bleeding.  We will obtain urine pregnancy.  I have sent    off STD panel and wet prep and we will proceed from there.       DIAGNOSTIC STUDIES:   Urine pregnancy was negative.  Hemoglobin and hematocrit are 12.6 and 37.  STD    panel is pending at this time.         COURSE:   Findings were discussed.  At this time, I discussed she really needs to follow    with GYN for further evaluation of her bleeding.  Discussed taking the birth    control pills that she was written.  Will have her use a little Bacitracin    ointment on the area on her left breast, warm compresses.  I will write a    prescription for ibuprofen.  She understands to certainly seek medical    attention any time worsening or new concerns.  She will be discharged with    dysfunctional uterine bleeding instruction sheet as well as folliculitis    instruction sheet.        FINAL DIAGNOSIS:   1. Evaluation acute vaginal bleeding dysfunctional uterine bleeding   2. Acute folliculitis left breast.       DISPOSITION AND PLAN:    The patient was discharged home in stable condition to follow up as above.     The patient was examined and evaluated by myself and Dr. Victorino Dike Himmel-Salch    who agrees with the above assessment and plan.           ___________________   Liberty Handy Himmel-Salch DO   Dictated By: Maurice Small. [place], [place]   [facility]   D:12/11/2010   T: 12/11/2010 08:02:56   [id number]

## 2011-04-25 NOTE — ED Provider Notes (Signed)
MEDICATION ADMINISTRATION SUMMARY              Drug Name: Sodium Chloride 0.9%, Intravenous, Dose Ordered: 1000 mL,         Route: IV Fluid, Status: Cancelled, Time: 09:44 04/25/2011,          Drug Name: Phenergan, Dose Ordered: 25 mg, Route: IV Med Infusion,         Status: Given, Time: 07:15 04/25/2011,          Drug Name: Sodium Chloride 0.9%, Intravenous, Dose Ordered: 1000 mL,         Route: IV Fluid, Status: Given, Time: 07:07 04/25/2011, Detailed         record available in Medication Service section.       KNOWN ALLERGIES   NKDA       TRIAGE [name] [name] Apr 25, 2011 06:44 SLA2)   PATIENT: NAME: [name] [name], AGE: 26, GENDER: female, DOB:         Sat 09-06-1985, TIME OF GREET: Sun Apr 25, 2011 06:38, SSN:         [id number], KG WEIGHT: 90.7, MEDICAL RECORD NUMBER: [id number], ACCOUNT         NUMBER: [id number], PCP: Pt Denies,. [name] [name] Apr 25, 2011 06:44         SLA2)   ADMISSION: URGENCY: 3, AMBULANCE: [place] #, TRANSPORT:         Ambulance - BLS, DEPT: Emergency, BED: 2ED 32. [name] [name] Apr 25, 2011         06:44 SLA2)   VITAL SIGNS: BP 118/57, Pulse 82, Resp 18, Temp 97.7, (Oral), O2         Sat 100, on Room air, Time 04/25/2011 06:43. (06:43 SLA2)   COMPLAINT:  Medic,Dizzy/Vomiting. [name] [name] Apr 25, 2011 06:44         SLA2)   PRESENTING COMPLAINT:  n/v/d, Since Today. (06:45 SLA2)   TREATMENT PRIOR TO ARRIVAL: None. (06:45 SLA2)   TB SCREENING: TB screen not applicable for this patient. (06:45         SLA2)   ABUSE SCREENING: Patient denies physical abuse or threats. (06:45         SLA2)   FALL RISK: Patient has a low risk of falling. (06:45 SLA2)   SUICIDAL IDEATION: Suicidal ideation is not present. (06:45         SLA2)   ADVANCE DIRECTIVES: Patient does not have advance directives.         (06:45 SLA2)   PROVIDERS: TRIAGE NURSE: Karlyn Agee, RN. [name] [name] Apr 25, 2011         06:44 SLA2)   PREVIOUS VISIT ALLERGIES: Nkda. [name] [name] Apr 25, 2011 06:44         SLA2)       PRESENTING PROBLEM (06:44 SLA2)       Presenting problems: Nausea, Vomiting (minor), Diarrhea - Adult.       AMBULANCE (06:25 RLS1)   AMBULANCE: Ambulance: Sun Apr 25, 2011 06:25.       CURRENT MEDICATIONS (06:44 SLA2)   Patient not taking meds       MEDICATION SERVICE   Phenergan:  Order: Phenergan (Promethazine Hydrochloride) -         Dose: 25 mg : IV Med Infusion         Ordered by: Vinnie Langton, MD         Entered by: Vinnie Langton, MD [name] [name] Apr 25, 2011 07:02 ,  Acknowledged by: Karlyn Agee, RN Sun Apr 25, 2011 07:09         Documented as given by: Lubertha South, RN Sun Apr 25, 2011 07:15          Patient, Medication, Dose, Route and Time verified prior to         administration.          Amount given: 25mg , IV SITE #1 into right antecubital, IV SITE #1         IVPB or drip, initial infusion, Premixed, at 200 ml/hr, Connections         checked prior to administration, Line traced prior to administration,         Catheter placement confirmed via flush prior to administration, IV         site without signs or symptoms of infiltration during medication         administration, No swelling during administration, No drainage during         administration, IV flushed after administration, Correct patient,         time, route, dose and medication confirmed prior to administration,         Patient advised of actions and side-effects prior to administration,         Allergies confirmed and medications reviewed prior to administration,         Patient in position of comfort, Side rails up, Cart in lowest         position, Call light in reach.    : Follow Up : No signs or symptoms of allergic reaction noted,         Decreased nausea, _IV SITE #1:_, Medication infusion discontinued, on         Sun Apr 25, 2011 07:55, 15 minutes, ., Total amount infused: 25mg .         (07:30 ENL2)   Sodium Chloride 0.9%, Intravenous:  Order: Sodium Chloride 0.9%,         Intravenous (Sodium Chloride) - Dose: 1000 mL : IV Fluid         Ordered by: Vinnie Langton, MD          Entered by: Vinnie Langton, MD Sun Apr 25, 2011 07:01          Documented as given by: Karlyn Agee, RN Sun Apr 25, 2011 07:07          Patient, Medication, Dose, Route and Time verified prior to         administration.          Amount given: 0700, IV SITE #1 into right wrist, IV SITE #1 IV         fluids established, IV SITE #1 1st bag hung, amount 1 Liter, IV SITE         #1 bolus of 1000 ml established, IV SITE #1 Rate of bolus, wide open,         via primary tubing, Connections checked prior to administration, Line         traced prior to administration, Catheter placement confirmed via         flush prior to administration, IV site without signs or symptoms of         infiltration during medication administration, No swelling during         administration, No drainage during administration, IV flushed after         administration, Correct patient, time, route, dose and medication  confirmed prior to administration, Patient advised of actions and         side-effects prior to administration, Allergies confirmed and         medications reviewed prior to administration, Patient in position of         comfort, Side rails up, Cart in lowest position, Family at bedside,         Call light in reach.    : Follow Up : _IV SITE #1:_, IV fluid infusion discontinued, on         Sun Apr 25, 2011 10:15, Total fluid hydration time IV site 1 2 hours,         . (09:30 ENL2)   (CANCELLED) Sodium Chloride 0.9%, Intravenous:  Order: Sodium         Chloride 0.9%, Intravenous (Sodium Chloride) - Dose: 1000         mL : IV Fluid         Ordered by: Marlana Salvage, PA-C         Entered by: Marlana Salvage, PA-C Sun Apr 25, 2011 08:40          Cancelled by: Marlana Salvage, PA-C. [name] [name] Apr 25, 2011 09:44          Cancel reason: Change in medication plan.       ORDERS   IV Set - Primary Tubing:  Ordered for: Arvella Merles, MD, Rolm Gala         Status: Active. (08:12 ENL2)   [place]:  Ordered for: Arvella Merles, MD, Rolm Gala          Status: Active. (08:12 ENL2)   Elita Boone IV Cath:  Ordered for: Arvella Merles, MD, Rolm Gala         Status: Active. (08:12 ENL2)   IV Start kit:  Ordered for: Arvella Merles, MD, Rolm Gala         Status: Active. (08:12 ENL2)   PO Fluid [place]:  Ordered for: Arvella Merles, MD, Rolm Gala         Status: Done by Dionisio Paschal, RN, Elige Ko Apr 25, 2011 08:50. (08:41         EI)       NURSING ASSESSMENT: ABDOMEN (06:45 SLA2)   CONSTITUTIONAL: Patient arrives, via stretcher, via Emergency         [facility] Services, Gait steady, Patient appears, generally ill,         Patient cooperative, Patient alert, Oriented to person, place and         time, Skin warm, Skin dry, Skin normal in color, Mucous membranes         pink, Mucous membranes moist, Patient is well-groomed, Patient         complains of n/v/d, pt states she started having n/v/d and dizziness         this am while at work. pt also had a loose stool on self while         coughing.   ABDOMEN: Abdomen soft, non-tender, Bowel sound normal, Associated         with nausea, Associated with vomiting, Associated with diarrhea.   GENITOURINARY FEMALE: no associated urinary complaints, no         associated vaginal discharge.   SAFETY: Side rails up, Cart/Stretcher in lowest position, Call         light within reach, Hospital ID band on.       NURSING PROCEDURE: DISCHARGE NOTE (10:13 ENL2)   DISCHARGE: Patient discharged to home, ambulating without  assistance, friend driving, accompanied by friend, Summary of Care         printed/ provided, Discharge instructions given to patient, IV         discontinued at D/C BY MALINA, Simple or moderate discharge teaching         performed, by THIS RN, Prescriptions given and instructions on side         effects given, Name of prescription(s) given: PHENERGAN, Above         person(s) verbalized understanding of discharge instructions and         follow-up care, Patient treated and evaluated by physician.    BELONGINGS: cigarettes, Belongings remain with patient, Valuables         remain with patient.       NURSING PROCEDURE: IV   PATIENT IDENITIFIER: Patient's identity verified by patient         stating name, Patient's identity verified by patient stating birth         date, Patient's identity verified by hospital ID bracelet. (07:07         RMW0)     Patient's identity verified by patient stating name, Patient's identity         verified by patient stating birth date, Patient's identity verified         by hospital ID bracelet. (09:57 MNT0)   IV SITE 1: IV established, to the right wrist, using a 20 gauge         catheter, in one attempt, Flushed with normal saline (mls): 10, Labs         drawn at time of placement, labeled in the presence of the patient         and sent to lab, Labs drawn at 0705, Tourniquet removed from patient         after procedure. (07:07 RMW0)   FOLLOW-UP SITE 1: IV discontinued, as ordered, due to patient         being discharged, catheter intact. (09:57 MNT0)   NOTES: Patient tolerated procedure well. (09:57 MNT0)   SAFETY: Side rails up, Cart/Stretcher in lowest position, Call         light within reach, Hospital ID band on. (07:07 RMW0)       NURSING PROCEDURE: PO CHALLENGE (09:00 MNT0)   PATIENT IDENTIFIER: Patient's identity verified by patient         stating name, Patient's identity verified by patient stating birth         date, Patient's identity verified by hospital ID bracelet.   PO CHALLENGE: Oral challenge performed, patient given water,         amount (mL) .   FOLLOW-UP: After procedure, patient tolerated oral challenge.   NOTES: Patient tolerated procedure well.   SAFETY: Side rails up, Cart/Stretcher in lowest position, Call         light within reach, Hospital ID band on.       DIAGNOSIS (09:44 EI)   FINAL: PRIMARY: vomiting, ADDITIONAL: dehydration, Diarrhea.       DISPOSITION   PATIENT:  Disposition Type: Discharged, Disposition: Discharged,          Condition: Stable. (09:44 EI)      Patient left the department. (10:17 ENL2)       VITAL SIGNS   VITAL SIGNS: BP: 118/57, Pulse: 82, Resp: 18, Temp: 97.7 (Oral),         O2 sat: 100 on Room air, Time: 04/25/2011 06:43. (06:43 SLA2)  BP: 108/50 (Lying), Pulse: 76, Resp: 18, Pain: 8headache, O2 sat: 99 on         Room air, Time: 04/25/2011 08:11. (08:11 ENL2)     BP: 112/46 (Lying), Pulse: 84, Resp: 18, Temp: 98.3 (Oral), Pain: 8-HA,         O2 sat: 100 on Room air, Time: 04/25/2011 09:54. (09:54 MNT0)       INSTRUCTION (09:01 EI)   DISCHARGE:  VOMITING (NAUSEA, THROWING UP), DIARRHEA -([facility]),         DEHYDRATION - IV FLUIDS.   Elenor QuinonesRayetta Humphrey, MEDICINE, [address]         [place] [address], [phone].   SPECIAL:  Clear liquids for next 12 hours and then advance diet         as tolerated.          Follow up with primary care physician         Return to the ER if condition worsens or new symptoms develop.         Do not drive or operate machinery while medicated with Phenergan.       PRESCRIPTION (08:59 EI)   Phenergan:  Tablet : 25 mg : Oral : Quantity: *** 1 *** Unit: tab         Route: Oral Schedule: As needed every 6 hours Dispense: *** 10 ***.   NOTES:  nausea          No refills          May use generic.   Key:     EHK1=Kisa, MD, Darolyn Rua, PA-C, Erin  ENL2=Lopienski, RN, Consuella Lose     MNT0=Townsend, ACT III, Malina  RLS1=Senn, RN, [employer]  RMW0=Walker, ACT     III, Romina     SLA2=Acevedo, RN, Carollee Herter

## 2011-06-25 NOTE — ED Provider Notes (Signed)
MEDICATION ADMINISTRATION SUMMARY              Drug Name: Zofran ODT, Dose Ordered: 4 mg, Route: Oral, Status:         Given, Time: 17:01 06/25/2011,          Drug Name: Imodium A-D, Dose Ordered: 2 tablets , Route: Oral,         Status: Given, Time: 17:00 06/25/2011,          Drug Name: Acetaminophen, Dose Ordered: 975 mg, Route: Oral, Status:         Given, Time: 17:00 06/25/2011, Detailed record available in Medication         Service section.       KNOWN ALLERGIES   NKDA (Unconfirmed)       TRIAGE (Fri Jun 25, 2011 15:39 ENL2)   PATIENT: NAME: [name] [name], AGE: 26, GENDER: female, DOB:         Sat 1985-06-22, TIME OF GREET: Fri Jun 25, 2011 15:37, SSN:         [id number], MEDICAL RECORD NUMBER: [id number], ACCOUNT NUMBER: [id number],         PCP: Pt Denies,. Caleen Essex Jun 25, 2011 15:39 ENL2)   ADMISSION: URGENCY: 3, TRANSPORT: Ambulatory, DEPT: Emergency,         BED: WAITING. Caleen Essex Jun 25, 2011 15:39 ENL2)   COMPLAINT:  Abd pain /Ha. (Fri Jun 25, 2011 15:39 ENL2)   PRESENTING COMPLAINT:  Abdominal pain vomiting diarrhea started         this AM. (16:18 RQO0)   PAIN: Patient complains of pain, Pain described as aching, On a         scale 0-10 patient rates pain as 10, Pain is constant. (16:18         RQO0)   IMMUNIZATIONS:  Immunizations up to date, Last tetanus shot         received less than 10 years ago. (16:18 RQO0)   LMP: Last menstrual period: 06-12-2011. (16:20 RQO0)   TB SCREENING: TB screen not applicable for this patient. (16:18         RQO0)   ABUSE SCREENING: Patient denies physical abuse or threats. (16:18         RQO0)   FALL RISK: Fall risk assessment not applicable to this patient.         (16:18 RQO0)   SUICIDAL IDEATION: Not Applicable. (16:18 RQO0)   ADVANCE DIRECTIVES: Patient does not have advance directives.         (16:18 RQO0)   PROVIDERS: TRIAGE NURSE: Lubertha South, RN. Caleen Essex Jun 25, 2011         15:39 ENL2)   PREVIOUS VISIT ALLERGIES: Nkda. Caleen Essex Jun 25, 2011 15:39         ENL2)        PRESENTING PROBLEM (15:41 ENL2)      Presenting problems: Abdominal Pain, Headache, Vomiting, Diarrhea -         Adult.       CURRENT MEDICATIONS (16:52 RQO0)   Patient not taking meds       MEDICATION SERVICE   Acetaminophen:  Order: Acetaminophen - Dose: 975 mg :         Oral         Ordered by: Farrel Demark, PA-C         Entered by: Farrel Demark, PA-C Fri Jun 25, 2011 16:52 ,          Acknowledged by: Lurena Joiner  Enis Slipper RN Fri Jun 25, 2011 16:52         Documented as given by: Rachael Darby, RN Caleen Essex Jun 25, 2011 17:00          Patient, Medication, Dose, Route and Time verified prior to         administration.          Amount given: 975 mgs, Site: Medication administered P.O., Correct         patient, time, route, dose and medication confirmed prior to         administration, Patient advised of actions and side-effects prior to         administration, Allergies confirmed and medications reviewed prior to         administration, Patient tolerated procedure well, Administered by         Bonney Leitz RN BSN, Cart in lowest position, Family at bedside.   Imodium A-D:  Order: Imodium A-D (Loperamide Hydrochloride) -         Dose: 2 tablets : Oral         Ordered by: Farrel Demark, PA-C         Entered by: Farrel Demark, PA-C Fri Jun 25, 2011 16:52 ,          Acknowledged by: Rachael Darby, RN Fri Jun 25, 2011 16:52         Documented as given by: Rachael Darby, RN Fri Jun 25, 2011 17:00          Patient, Medication, Dose, Route and Time verified prior to         administration.          Amount given: 2 tabs, Site: Medication administered P.O., Correct         patient, time, route, dose and medication confirmed prior to         administration, Patient advised of actions and side-effects prior to         administration, Allergies confirmed and medications reviewed prior to         administration, Patient tolerated procedure well, Administered by          Bonney Leitz RN BSN, Cart in lowest position, Family at bedside.   Zofran ODT:  Order: Zofran ODT (Ondansetron) - Dose: 4         mg : Oral         Ordered by: Farrel Demark, PA-C         Entered by: Farrel Demark, PA-C Fri Jun 25, 2011 16:51 ,          Acknowledged by: Rachael Darby, RN Fri Jun 25, 2011 16:52         Documented as given by: Rachael Darby, RN Fri Jun 25, 2011 17:01          Patient, Medication, Dose, Route and Time verified prior to         administration.          Amount given: 4 mgs, Site: Medication administered S.L., Correct         patient, time, route, dose and medication confirmed prior to         administration, Patient advised of actions and side-effects prior to         administration, Allergies confirmed and medications reviewed prior to         administration, Patient tolerated procedure well, Administered by         Bonney Leitz RN BSN, Cart in lowest  position, Family at bedside.       ORDERS (16:44 DIH0)   IV- Saline Lock:  Ordered for: Jerolyn Center, MD, Molly         Status: Cancelled by Lindie Spruce, LPN, Rema Jasmine Jun 25, 2011 16:53.       NURSING ASSESSMENT: ABDOMEN (16:19 RQO0)   CONSTITUTIONAL: History obtained from patient, Patient arrives         ambulatory, Gait steady, Patient cooperative, Patient alert, Oriented         to person, place and time, Skin normal in color, Mucous membranes         pink, Mucous membranes moist.   PAIN: aching pain, diffusely, constant, on a scale 0-10 patient         rates pain as 10.   ABDOMEN: Abdomen assessment findings include abdomen symmetrical,         Abdomen soft, Associated with vomiting, currently, Associated with         diarrhea, loose.   SAFETY: Cart/Stretcher in lowest position, Family at bedside.       NURSING PROCEDURE: DISCHARGE NOTE (17:34 RQO0)   DISCHARGE:  Patient discharged to home, ambulating without         assistance, accompanied by other family member, Discharge          instructions given to patient, Simple or moderate discharge teaching         performed, Prescriptions given and instructions on side effects         given, Above person(s) verbalized understanding of discharge         instructions and follow-up care, Patient discharged by, PA, Patient         treated and evaluated by physician.       NURSING PROCEDURE: NURSE NOTES (16:51 RQO0)   NURSES NOTES: Notes: Pt refused her IVL fluids and meds she just         wants a prescription for nausea and go home Lafonda Mosses PA made aware.       DIAGNOSIS (17:53 DIH0)   FINAL: PRIMARY: vomiting and diarrhea, ADDITIONAL: acute         cephalgia.       DISPOSITION   PATIENT:  Disposition Type: Discharged, Disposition: Discharge,         Condition: Stable. (17:53 DIH0)      Patient left the department. (18:01 RQO0)       VITAL SIGNS (15:41 ENL2)   VITAL SIGNS: BP: 124/64 (Sitting), Pulse: 91, Resp: 18, Temp:         98.0 (Oral), Pain: 10, O2 sat: 100 on Room air, Time: 06/25/2011         15:41.       INSTRUCTION (16:52 DIH0)   DISCHARGE:  VOMITING (NAUSEA, THROWING UP), DIARRHEA -([facility]).   SPECIAL:  immodium as needed for diarrhea          frequent liquids, slowly advance diet as improving          Follow up with your physician if not improved in 2-3 days.         Return to the ER if condition worsens or new symptoms develop.       PRESCRIPTION (16:56 DIH0)   Phenergan:  Tablet : 25 mg : Oral : Quantity: *** 1 *** Unit:         Route: Oral Schedule: As needed every 6 hours Dispense: *** 12 ***.   NOTES:  as needed for nausea  No refills          May use generic.       ADMIN (18:01 RQO0)   DIGITAL SIGNATURE:  Enis Slipper, RN, Lurena Joiner.   Key:     DIH0=Houle, PA-C, Lafonda Mosses  ENL2=Lopienski, RN, Marcie Mowers, RN,     Lurena Joiner

## 2011-11-02 NOTE — ED Provider Notes (Signed)
[facility]                                     NURSE CHART   KNOWN ALLERGIES   NKDA   TRIAGE (Tue Nov 02, 2011 23:03 SNF2)   PATIENT: NAME: [name] [name]: female, DOB: Jul 06, 1985, TIME OF GREET: Tue Nov 02, 2011 22:56 by Orvan July,         RN, LANGUAGE: [place]. (Tue Nov 02, 2011 23:03 SNF2)   ADMISSION: URGENCY: 3, TRANSPORT: Ambulatory, DEPT: Emergency,         BED: WAITING. (Tue Nov 02, 2011 23:03 SNF2)   COMPLAINT:  thigh numbness at times, neck kpain. (Tue Nov 02, 2011 23:03 SNF2)   PRESENTING COMPLAINT:  c/o bilateral thigh numbness and tingling         and right sided neck pain x 1 week. (23:57 BBH1)   PAIN: Patient complains of pain, Pain described as tingling, On a         scale 0-10 patient rates pain as 10, bilateral thighs and neck, Pain         is constant, Onset was 1 week ago. (23:57 BBH1)   TREATMENT PRIOR TO ARRIVAL: None. (23:57 BBH1)   TB SCREENING: Unable to assess for TB. (23:57 BBH1)   ABUSE SCREENING: Unable to assess. (23:57 BBH1)   FALL RISK: Patient has a low risk of falling. (23:57 BBH1)   SUICIDAL IDEATION: Suicidal ideation is not present. (23:57 BBH1)   ADVANCE DIRECTIVES: Patient does not have advance directives.         (23:57 BBH1)   PROVIDERS: TRIAGE NURSE: Orvan July, RN. (Tue Nov 02, 2011         23:03 SNF2)   PRESENTING PROBLEM (Tue Nov 02, 2011 23:03 SNF2)      Presenting problems: Neck Injury-Pain-Swelling.   GREET   NOTES: Patient arrived ambulatory. (Tue Nov 02, 2011 23:03 SNF2)   GREET: Greet: Tue Nov 02, 2011 22:56. (22:56 SNF2)   CURRENT MEDICATIONS (23:57 BBH1)   Patient not taking meds   ORDERS (Wed Nov 03, 2011 00:08 BRI1)   Urine HCG:  Ordered for: Luciano Cutter, MD, Irving Burton         Status: Done by: Kizzie Bane RN, Brande - Wed Nov 03, 2011 00:15.   NURSING ASSESSMENT: EXTREMITY LOWER (23:59 BBH1)   CONSTITUTIONAL: Complex assessment performed, History obtained          from patient, Patient arrives ambulatory, Gait steady, Patient         appears comfortable, Patient cooperative, Patient alert, Oriented to         person, place and time, Skin warm, Skin dry, Skin normal in color,         Mucous membranes pink, Mucous membranes moist, Patient is         well-groomed, Patient complains of c/o tingling and numbness to         bilateral thighs and right neck x 1 week.   PAIN: tingling pain, to the left upper leg, to the right upper         leg, neck, Onset of pain 1 week ago, on a scale 0-10 patient rates         pain as 10, Pain exacerbated by nothing, Nothing has been tried to  Name: [name], [name]  DOB: 07/01/1985 F25 MedRec: [id number]  AcctNum:     [id number]     Prepared: Thu Nov 04, 2011 13:21 by                           Page 1 of 3   &#x0C;                                  [facility]                                     NURSE CHART         alleviate the pain.   LEFT LOWER EXTREMITY: Left lower extremity assessment findings         include capillary refill less than 2 seconds, Skin color normal, Skin         temperature warm, Distal sensation intact, Muscle tone normal,         Inspection findings include no rash, Inspection findings include no         redness, Inspection findings include no signs of trauma, Inspection         findings include no swelling, Notes: tingling to left thigh.   RIGHT LOWER EXTREMITY: Right lower extremity assessment findings         include capillary refill less than 2 seconds, Skin color normal, Skin         temperature warm, Distal sensation intact, Muscle tone normal,         Inspection findings include no rash, Inspection findings include no         redness, Inspection findings include no signs of trauma, Inspection         findings include no swelling, Notes: tingling to right thigh.   NURSING PROCEDURE: DISCHARGE NOTE (Wed Nov 03, 2011 00:46 BBH1)   DISCHARGE: Patient discharged to home, ambulating without          assistance, friend driving, accompanied by friend, Discharge         instructions given to patient, Simple or moderate discharge teaching         performed, by [facility], Prescriptions given and instructions on         side effects given, Name of prescription(s) given: ROBAXIN,         Acetaminophen-HYDROcodone Bitartrate, Above person(s) verbalized         understanding of discharge instructions and follow-up care.   NURSING PROCEDURE: URINE COLLECTION (Wed Nov 03, 2011 00:22 BBH1)   URINE COLLECTION FEMALE: Urine collected by mid-stream clean         catch, urine yellow in color, and clear, Urine pregnancy test,         negative, Quality control line positive.   DIAGNOSIS (Wed Nov 03, 2011 00:08 BRI1)   FINAL: PRIMARY: Thigh pain.   DISPOSITION   PATIENT:  Disposition Type: Discharged, Disposition: Discharge,         Condition: Stable. (Wed Nov 03, 2011 00:08 BRI1)      Patient left the department. (Wed Nov 03, 2011 00:47 BBH1)   VITAL SIGNS (23:33 LAB2)   VITAL SIGNS: BP: 134/62 (Sitting), Pulse: 90, Resp: 18, Temp:         98.5 (Oral), Pain: 10, O2 sat: 98 on Room air, Time: 11/02/2011  23:31.   INSTRUCTION (Wed Nov 03, 2011 00:41 BRI1)   FOLLOWUPRayetta Humphrey, MEDICINE, [address],         [place] [address], [phone], Ivin Booty, DERMATOLOGY, [id number], [place] [address], [phone], Noel Journey,         DERMATOLOGY, [address], [place] [address], [phone].   SPECIAL:  Medications prescribed for pain and muscle relaxation.         Do not drive while taking.         Follow up with primary care physician         Return to the ER if condition worsens or new symptoms develop.   EVENTS   Name: [name], [name]  DOB: Apr 01, 1986 F25 MedRec: [id number]  AcctNum:     [id number]     Prepared: Thu Nov 04, 2011 13:21 by                           Page 2 of 3   &#x0C;                                  [facility]                                      NURSE CHART   TRANSFER:  Triage to Emergency Waiting. (Tue Nov 02, 2011 23:03         SNF2)      Emergency Waiting to Main 72. (23:51 SNF2)      Removed from Emergency Main 23. (Wed Nov 03, 2011 00:47 BBH1)   PRESCRIPTION (Wed Nov 03, 2011 00:08 BRI1)   Acetaminophen-HYDROcodone Bitartrate:  Tablet : 325 Mg-5 Mg :         Oral : Quantity: *** 1-2 *** Unit: tab(s) Route: Oral Schedule: As         needed every 4 to 6 hours Dispense: *** 15 ***.   NOTES:  No refills          May use generic.   Robaxin-750:  Tablet : 750 Mg : Oral : Quantity: *** 1-2 ***         Unit: tabs Route: Oral Schedule: As needed every 6 hours Dispense:         *** 20 ***.   NOTES   Key:     BBH1=Hughes, RN, [place] Severin BRI1=Irwin, PA-C, [place] LAB2=Bioc, ACT III,     [employer]     SNF2=Fleming, RN, [place]   Name: [name], [name]  DOB: Jan 28, 1986 F25 MedRec: [id number]  AcctNum:     [id number]     Prepared: Thu Nov 04, 2011 13:21 by                           Page 3 of 3   &#x0C;

## 2011-11-02 NOTE — ED Provider Notes (Signed)
KNOWN ALLERGIES   NKDA   TRIAGE (Tue Nov 02, 2011 23:03 SNF2)   PATIENT: NAME: [name] [name]: female, DOB: 09/08/1985, TIME OF GREET: Tue Nov 02, 2011 22:56 by Orvan July,         RN, LANGUAGE: [place]. (Tue Nov 02, 2011 23:03 SNF2)   ADMISSION: URGENCY: 3, TRANSPORT: Ambulatory, DEPT: Emergency,         BED: WAITING. (Tue Nov 02, 2011 23:03 SNF2)   COMPLAINT:  thigh numbness at times, neck kpain. (Tue Nov 02, 2011 23:03 SNF2)   PRESENTING COMPLAINT:  c/o bilateral thigh numbness and tingling         and right sided neck pain x 1 week. (23:57 BBH1)   PAIN: Patient complains of pain, Pain described as tingling, On a         scale 0-10 patient rates pain as 10, bilateral thighs and neck, Pain         is constant, Onset was 1 week ago. (23:57 BBH1)   TREATMENT PRIOR TO ARRIVAL: None. (23:57 BBH1)   TB SCREENING: Unable to assess for TB. (23:57 BBH1)   ABUSE SCREENING: Unable to assess. (23:57 BBH1)   FALL RISK: Patient has a low risk of falling. (23:57 BBH1)   SUICIDAL IDEATION: Suicidal ideation is not present. (23:57 BBH1)   ADVANCE DIRECTIVES: Patient does not have advance directives.         (23:57 BBH1)   PROVIDERS: TRIAGE NURSE: Orvan July, RN. (Tue Nov 02, 2011         23:03 SNF2)   PRESENTING PROBLEM (Tue Nov 02, 2011 23:03 SNF2)      Presenting problems: Neck Injury-Pain-Swelling.   GREET   NOTES: Patient arrived ambulatory. (Tue Nov 02, 2011 23:03 SNF2)   GREET: Greet: Tue Nov 02, 2011 22:56. (22:56 SNF2)   CURRENT MEDICATIONS (23:57 BBH1)   Patient not taking meds   ORDERS (Wed Nov 03, 2011 00:08 BRI1)   Urine HCG:  Ordered for: Luciano Cutter, MD, Irving Burton         Status: Done by: Kizzie Bane RN, Brande - Wed Nov 03, 2011 00:15.   NURSING ASSESSMENT: EXTREMITY LOWER (23:59 BBH1)   CONSTITUTIONAL: Complex assessment performed, History obtained         from patient, Patient arrives ambulatory, Gait steady, Patient          appears comfortable, Patient cooperative, Patient alert, Oriented to         person, place and time, Skin warm, Skin dry, Skin normal in color,         Mucous membranes pink, Mucous membranes moist, Patient is         well-groomed, Patient complains of c/o tingling and numbness to         bilateral thighs and right neck x 1 week.   PAIN: tingling pain, to the left upper leg, to the right upper         leg, neck, Onset of pain 1 week ago, on a scale 0-10 patient rates         pain as 10, Pain exacerbated by nothing, Nothing has been tried to   Name: Carlyon, Nolasco  DOB: [birth year] F25 MedRec: [id number]  AcctNum:     [id number]         alleviate the pain.   LEFT LOWER EXTREMITY: Left lower extremity assessment findings  include capillary refill less than 2 seconds, Skin color normal, Skin         temperature warm, Distal sensation intact, Muscle tone normal,         Inspection findings include no rash, Inspection findings include no         redness, Inspection findings include no signs of trauma, Inspection         findings include no swelling, Notes: tingling to left thigh.   RIGHT LOWER EXTREMITY: Right lower extremity assessment findings         include capillary refill less than 2 seconds, Skin color normal, Skin         temperature warm, Distal sensation intact, Muscle tone normal,         Inspection findings include no rash, Inspection findings include no         redness, Inspection findings include no signs of trauma, Inspection         findings include no swelling, Notes: tingling to right thigh.   NURSING PROCEDURE: DISCHARGE NOTE (Wed Nov 03, 2011 00:46 BBH1)   DISCHARGE: Patient discharged to home, ambulating without         assistance, friend driving, accompanied by friend, Discharge         instructions given to patient, Simple or moderate discharge teaching         performed, by [facility], Prescriptions given and instructions on          side effects given, Name of prescription(s) given: ROBAXIN,         Acetaminophen-HYDROcodone Bitartrate, Above person(s) verbalized         understanding of discharge instructions and follow-up care.   NURSING PROCEDURE: URINE COLLECTION (Wed Nov 03, 2011 00:22 BBH1)   URINE COLLECTION FEMALE: Urine collected by mid-stream clean         catch, urine yellow in color, and clear, Urine pregnancy test,         negative, Quality control line positive.   DIAGNOSIS (Wed Nov 03, 2011 00:08 BRI1)   FINAL: PRIMARY: Thigh pain.   DISPOSITION   PATIENT:  Disposition Type: Discharged, Disposition: Discharge,         Condition: Stable. (Wed Nov 03, 2011 00:08 BRI1)      Patient left the department. (Wed Nov 03, 2011 00:47 BBH1)   VITAL SIGNS (23:33 LAB2)   VITAL SIGNS: BP: 134/62 (Sitting), Pulse: 90, Resp: 18, Temp:         98.5 (Oral), Pain: 10, O2 sat: 98 on Room air, Time: 11/02/2011 23:31.   INSTRUCTION (Wed Nov 03, 2011 00:41 BRI1)   FOLLOWUPRayetta Humphrey, MEDICINE, [address]         [place] [address], [phone], Ivin Booty, DERMATOLOGY, [address], [place] [address], [phone], Noel Journey,         DERMATOLOGY, [address], [place] [address], [phone].   SPECIAL:  Medications prescribed for pain and muscle relaxation.         Do not drive while taking.         Follow up with primary care physician         Return to the ER if condition worsens or new symptoms develop.   EVENTS   Name: Aerabella, Galasso  DOB: January 06, 1986 F25 MedRec: [id number]  AcctNum:     [id number]   TRANSFER:  Triage to Emergency Waiting. Halford Decamp Nov 02, 2011 23:03  SNF2)      Emergency Waiting to Main 23. (23:51 SNF2)      Removed from Emergency Main 23. (Wed Nov 03, 2011 00:47 BBH1)   PRESCRIPTION (Wed Nov 03, 2011 00:08 BRI1)   Acetaminophen-HYDROcodone Bitartrate:  Tablet : 325 Mg-5 Mg :         Oral : Quantity: *** 1-2 *** Unit: tab(s) Route: Oral Schedule: As          needed every 4 to 6 hours Dispense: *** 15 ***.   NOTES:  No refills          May use generic.   Robaxin-750:  Tablet : 750 Mg : Oral : Quantity: *** 1-2 ***         Unit: tabs Route: Oral Schedule: As needed every 6 hours Dispense:         *** 20 ***.   NOTES   Key:     BBH1=Hughes, RN, [place] Severin BRI1=Irwin, PA-C, [place] LAB2=Bioc, ACT III,     [employer]     SNF2=Fleming, RN, [place]   Name: Kallie, Depolo  DOB: 27-Nov-1985 F25 MedRec: [id number]  AcctNum:     [id number]

## 2011-11-10 NOTE — ED Provider Notes (Signed)
MEDICATION ADMINISTRATION SUMMARY         Drug Name: *Sodium Chloride 0.9%, Intravenous, Dose Ordered: 1000 mL,         Route: IV Fluid, Status: Given, Time: 16:30 11/10/2011,         Drug Name: Ibuprofen, Dose Ordered: 800 mg, Route: Oral, Status:         Given, Time: 16:30 11/10/2011, *Additional information available in         notes, Detailed record available in Medication Service section.   KNOWN ALLERGIES   NKDA (Unconfirmed)   TRIAGE (Wed Nov 10, 2011 15:17 TJM0)   PATIENT: DOB: Sat Aug 28, 1985, TIME OF GREET: Wed Nov 10, 2011         15:17 by Theodis Sato, RN, LANGUAGE: [place]. (Wed Nov 10, 2011 15:17         TJM0)     NAME: [name] [name], [name] [name]. (15:20 KMS9)   ADMISSION: URGENCY: 3, TRANSPORT: Ambulatory, DEPT: Emergency,         BED: WAITING. (Wed Nov 10, 2011 15:17 TJM0)   COMPLAINT:  prolonged menses x 3 mon - dizzy. (Wed Nov 10, 2011         15:17 TJM0)   PRESENTING COMPLAINT:  suprapubic pelvic pain x 2-3 weeks,         vaginal bleeding x 3 months - heavier x 1 month. (16:39 DLC3)   IMMUNIZATIONS:  Immunizations up to date, Last tetanus shot         received less than 5 years ago. (16:39 DLC3)   LMP: Last menstrual period: 04-27-2011. (16:39 DLC3)   TREATMENT PRIOR TO ARRIVAL: None. (16:39 DLC3)   TB SCREENING: TB screen not applicable for this patient. (16:39         DLC3)   ABUSE SCREENING: Patient denies physical abuse or threats. (16:39         DLC3)   FALL RISK: Patient has a low risk of falling. (16:39 DLC3)   SUICIDAL IDEATION: Suicidal ideation is not present. (16:39 DLC3)   ADVANCE DIRECTIVES: Unknown if patient has advance directives.         (16:39 DLC3)   PROVIDERS: TRIAGE NURSE: Theodis Sato, RN. (Wed Nov 10, 2011         15:17 TJM0)   PREVIOUS VISIT ALLERGIES: NKDA. (16:39 DLC3)   PRESENTING PROBLEM (Wed Nov 10, 2011 15:17 TJM0)      Presenting problems: Vaginal Bleed - Adult, Dizziness.   GREET (15:17 TJM0)   GREET: Greet: Wed Nov 10, 2011 15:17.   CURRENT MEDICATIONS      No recorded medications   MEDICATION SERVICE   Ibuprofen:  Order: Ibuprofen - Dose: 800 mg : Oral         Ordered by: York Spaniel, PA-C         Entered by: York Spaniel, PA-C Wed Nov 10, 2011 16:05 ,   Name: [name] [name], [name] [name]  DOB: 1986/03/15 F25 MedRec: [id number]  AcctNum:     [id number]         Acknowledged by: Durene Fruits, RN,BSN Wed Nov 10, 2011 16:26         Documented as given by: Durene Fruits, RN,BSN Wed Nov 10, 2011 16:30         Patient, Medication, Dose, Route and Time verified prior to         administration.          Amount given: 800 mg, Site: Medication administered P.O., Mouth  check performed after administration of medication, Correct patient,         time, route, dose and medication confirmed prior to administration,         Patient advised of actions and side-effects prior to administration,         Allergies confirmed and medications reviewed prior to administration,         Patient in position of comfort, Side rails up, Cart in lowest         position, Family at bedside, Call light in reach.   Sodium Chloride 0.9%, Intravenous:  Order: Sodium Chloride 0.9%,         Intravenous (Sodium Chloride) - Dose: 1000 mL : IV Fluid         Notes: Bolus         *Reminder* Enter reason for fluid below         For Hydration         Ordered by: York Spaniel, PA-C         Entered by: York Spaniel, PA-C Wed Nov 10, 2011 16:07 ,         Acknowledged by: Durene Fruits, RN,BSN Wed Nov 10, 2011 16:26         Documented as given by: Durene Fruits, RN,BSN Wed Nov 10, 2011 16:30         Patient, Medication, Dose, Route and Time verified prior to         administration.          Amount given: 1000 mL, IV SITE #1 into left antecubital, IV SITE #1         IV fluids established, IV SITE #1 1st bag hung, amount 1 Liter, IV         SITE #1 bolus of 1000 ml established, IV SITE #1 Rate of bolus, wide          open, via primary tubing, via gravity tubing, Connections checked         prior to administration, Line traced prior to administration,         Catheter placement confirmed via flush prior to administration, IV         site without signs or symptoms of infiltration during medication         administration, No swelling during administration, No drainage during         administration, IV flushed after administration, Correct patient,         time, route, dose and medication confirmed prior to administration,         Patient advised of actions and side-effects prior to administration,         Allergies confirmed and medications reviewed prior to administration,         Patient in position of comfort, Side rails up, Cart in lowest         position, Family at bedside, Call light in reach.   : Follow Up : No signs or symptoms of allergic reaction noted,         _IV SITE #1:_, IV fluid infusion discontinued, on Wed Nov 10, 2011         17:21, 55 minutes, . (17:21 AC)   ORDERS   Urine dip (send for lab U/A if positive):  Ordered for: Wenda Overland, DO, Jennifer         Status: Done by: Arsenio Katz, PM Marry Guan - Wed Nov 10, 2011 16:22.         (  15:56 JLW)   Urine HCG:  Ordered for: Wenda Overland, DO, Jennifer         Status: Done by: Arsenio Katz, PM Rosanne Ashing), Leslye Peer Nov 10, 2011 16:22.         (15:56 JLW)   CBC, AUTOMATED DIFFERENTIAL:  Ordered for: Wenda Overland, DO,         Victorino Dike         Status: Done by: System - Wed Nov 10, 2011 16:49. (16:03 JLW)   Name: [name] [name], [name] [name]  DOB: 01/25/1986 F25 MedRec: [id number]  AcctNum:     [id number]   IV- Saline Lock:  Ordered for: Wenda Overland, DO, Jennifer         Status: Done by: Arsenio Katz, PM Rosanne Ashing), Fayrene Fearing - Wed Nov 10, 2011 16:31.         (16:03 JLW)   Pelvic Exam Setup:  Ordered for: Wenda Overland, DO, Jennifer         Status: Done by: Arsenio Katz, PM Rosanne Ashing), Fayrene Fearing - Wed Nov 10, 2011 16:36.         (16:03 JLW)   Orthostatic Vital Signs:  Ordered for: Wenda Overland, DO, Jennifer          Status: Done by: Rubin Payor - Wed Nov 10, 2011 17:02.         (16:59 JHS2)   NURSING ASSESSMENT: GENITOURINARY (16:50 DLC3)   CONSTITUTIONAL: Patient arrives ambulatory, Gait steady, Patient         appears comfortable, Patient cooperative, Patient alert, Oriented to         person, place and time, Skin warm, Skin dry, Skin normal in color,         Mucous membranes pink, Mucous membranes moist, Patient is         well-groomed.   PAIN FEMALE: to the suprapubic region, Pain radiates, to the         rectum, Onset of pain 2-3 weeks.   GENITOURINARY FEMALE: no associated urinary complaints,         Associated with vaginal bleeding , moderate amount, of bright red         blood.   ABDOMEN: Abdomen assessment findings include abdomen symmetrical,         Abdomen soft, tender, suprapubic, Bowel sound normal.   NURSING PROCEDURE: DISCHARGE NOTE (17:21 AC)   DISCHARGE: Patient discharged to home, ambulating without         assistance, family driving, accompanied by other family member,         Discharge instructions given to patient, Simple or moderate discharge         teaching performed, Prescriptions given and instructions on side         effects given, Name of prescription(s) given: motrin, vicodin, Above         person(s) verbalized understanding of discharge instructions and         follow-up care, Patient treated and evaluated by physician.   SAFETY: Side rails up, Cart/Stretcher in lowest position, Family         at bedside, Call light within reach, Hospital ID band on.   NURSING PROCEDURE: IV (16:32 JSB1)   PATIENT IDENITIFIER: Patient's identity verified by patient         stating name, Patient's identity verified by patient stating birth         date.   IV SITE 1: IV therapy indicated for medication administration, IV         established,  to the left antecubital, using a 20 gauge catheter, in         one attempt, Flushed with normal saline (mls): 10cc, Tourniquet          removed from patient after procedure., Labs labeled in the presence         of the patient and then sent to the Lab.   NURSING PROCEDURE: ORTHOSTATIC VITAL SIGNS (17:11 DLC3)   PATIENT IDENTIFIER: Patient's identity verified by patient         stating name, Patient's identity verified by patient stating birth         date.   ORTHOSTATIC VITAL SIGNS: Orthostatic vital signs indicated for         dizziness, Lying:, Systolic blood pressure: 113, Diastolic blood   Name: [name] [name], [name] [name]  DOB: [birth year] F25 MedRec: [id number]  AcctNum:     [id number]         pressure: 43, Pulse: 91, No dizziness, Sitting:, Systolic blood         pressure: 105, Diastolic blood pressure: 49, Pulse: 98, No dizziness         with position change, Standing:, Systolic blood pressure: 115,         Diastolic blood pressure: 48, Pulse: 89, No dizziness with position         change.   FOLLOW-UP: After procedure, results given to Dr. Wenda Overland,         and Nile Dear.   VITAL SIGNS: BP: 115, / 49, BP: (Standing), Pulse: 89, Resp: 22,         O2 sat: 100.   DIAGNOSIS (16:51 JLW)   FINAL: PRIMARY: Dysfunctional uterine bleeding (DUB).   DISPOSITION   PATIENT:  Disposition Type: Discharged, Disposition: Discharge,         Condition: Stable. (16:54 JLW)      Patient left the department. (17:21 AC)   VITAL SIGNS   VITAL SIGNS: BP: 122/64 (Sitting), Pulse: 87, Resp: 18, Temp:         98.6 (Oral), Pain: 8, O2 sat: 95 on Room air, Time: 11/10/2011 15:25.         (15:26 MLW0)     BP: 115/49 (Standing), Pulse: 89, Resp: 22, O2 sat: 100, Time: 11/10/2011         17:10. (17:11 DLC3)   INSTRUCTION (16:52 JLW)   DISCHARGE:  DYSFUNCTIONAL UTERINE BLEEDING (DUB, VAGINAL         BLEEDING).   [id number] [place]         [place] [address], [phone].   SPECIAL:  Drink plenty of fluids.         Follow up with Dr. Mayford Knife (GYN) for further evaluation.         Return to the ER if condition worsens or new symptoms develop -          increased bleeding, fainting, etc.         Do not drive or operate machinery while medicated.   EVENTS   DOCTOR EXTENDER: Mertie Moores, PA-C, Caprice Renshaw saw the patient at         [facility] Nov 10, 2011 15:56. (15:56 JLW)   TRANSFER:  Triage to Emergency Waiting. (Wed Nov 10, 2011 15:17         TJM0)      Emergency Waiting to Main 23. (15:53 TJM0)      Removed from Emergency Main 23. (17:21 AC)   PRESCRIPTION (16:52 JLW)  Motrin:  Tablet : 800 mg : Oral : Quantity: *** 1 *** Unit: tab         Route: Oral Schedule: every 8 hours Dispense: *** 30 ***.   NOTES:  prn swelling/pain          No refills          May use generic.   Vicodin:  Tablet : 500 mg-5 mg : Oral : Quantity: *** 1-2 ***   Name: [name] [name], [name] [name]  DOB: May 31, 1985 F25 MedRec: [id number]  AcctNum:     [id number]         Unit: tab Route: Oral Schedule: every 4 to 6 hours Dispense: *** 12         ***.   NOTES:  prn pain         max of 8 tabs/caps in 24 hours          No refills          May use generic.   ADMIN (15:34 TJM0)   DIGITAL SIGNATURE:  Hyacinth Meeker, RN, Tammy.   Key:     AC=Conley, RN, Marchelle Folks DLC3=Catalano, RN,BSN, Lupita Leash JHS2=Himmel Adron Bene, DO,     Jennifer     JLW=Whittington, PA-C, Caprice Renshaw JSB1=Bailie, PM (JIM), Fayrene Fearing     KMS9=Sanderson,     Nicole Cella     MLW0=Warren, ACT III, Morrell  TJM0=Miller, RN, Tammy   Name: [name] [name], [name] [name]  DOB: 08/03/1985 F25 MedRec: [id number]  AcctNum:     [id number]

## 2012-01-17 LAB — POC URINE MACROSCOPIC
Bilirubin: NEGATIVE
Bilirubin: NEGATIVE
Blood: NEGATIVE
Blood: NEGATIVE
Glucose: NEGATIVE mg/dl
Glucose: NEGATIVE mg/dl
Ketone: NEGATIVE mg/dl
Ketone: NEGATIVE mg/dl
Leukocyte Esterase: NEGATIVE
Leukocyte Esterase: NEGATIVE
Nitrites: NEGATIVE
Nitrites: NEGATIVE
Protein: NEGATIVE mg/dl
Protein: NEGATIVE mg/dl
Specific gravity: 1.025 (ref 1.005–1.030)
Specific gravity: 1.025 (ref 1.005–1.030)
Urobilinogen: 0.2 EU/dl (ref 0.0–1.0)
Urobilinogen: 0.2 EU/dl (ref 0.0–1.0)
pH (UA): 5.5 (ref 5–9)
pH (UA): 5.5 (ref 5–9)

## 2012-01-17 LAB — POC HCG,URINE
HCG urine, QL: NEGATIVE
HCG urine, QL: NEGATIVE

## 2012-01-17 NOTE — ED Provider Notes (Signed)
[facility]  EMERGENCY DEPARTMENT TREATMENT REPORT  NAME:  [name] [name]  SEX:   F  ADMIT: 01/17/2012  DOB:   1985-11-30  MR#    [id number]  ROOM:    TIME SEEN: 04 20 PM  ACCT#  [id number]        TIME OF EVALUATION:  1516    PRIMARY CARE PROVIDER:  Unknown.    CHIEF COMPLAINT:  Back pain.    HISTORY OF PRESENT ILLNESS:  This is a 26 year old female presenting to the emergency room today with  complaints of a 2-week history of low back pain.  The patient denies any falls  or injuries but states that she woke up with the pain 2 weeks ago.  The  patient's pain is described as aching.  It becomes more sharp with movement   and  intermittently radiates to her abdomen.  She denies any urinary symptoms,  denies any fevers, vomiting or diarrhea. No incontinence or retention of urine     or stool.    REVIEW OF SYSTEMS:  CONSTITUTIONAL:  No fever.  RESPIRATORY:  No shortness of breath.  CARDIOVASCULAR:  No chest pain.  GASTROINTESTINAL:  Reports that back pain intermittently radiates into the  abdomen.  No vomiting, no diarrhea.  GENITOURINARY:  Denies dysuria, frequency or urgency.  Denies hematuria, but  she does state she is currently on her menstrual cycle.  MUSCULOSKELETAL:  Positive for  low back pain.  The patient  denies any  radicular symptoms down her legs.    PAST MEDICAL HISTORY:  No chronic medical conditions.    SOCIAL HISTORY:  The patient works at the [employer] and she does smoke.    FAMILY HISTORY:  Noncontributory.    MEDICATIONS:  None.    ALLERGIES:  NO KNOWN DRUG ALLERGIES.    PHYSICAL EXAMINATION:  VITAL SIGNS:  Blood pressure 125/65, pulse 75, respirations 20, temperature   98,  pain 10 out of 10, O2 saturation 98% on room air.  GENERAL:  The patient is well nourished, well developed, answering questions  appropriately.  RESPIRATORY:  Clear and equal breath sounds.  No respiratory distress,  tachypnea, or accessory muscle use.    CARDIOVASCULAR:  Heart regular, without murmurs, gallops, rubs, or thrills.   GASTROINTESTINAL:   Normoactive bowel sounds.  Abdomen is soft, minimal  tenderness appreciated in the suprapubic region.  No rebound or guarding,  however, is appreciated.  Abdomen again soft.  MUSCULOSKELETAL:  The patient has no localized midline tenderness to her  cervical, thoracic or lumbar bodies.  She is tender paravertebrally of the  lumbosacral region bilaterally, more with palpation than with percussion.   There is no costovertebral angle tenderness that I can reproduce with   palpation  or percussion on my evaluation.  Movement however does reproduce the patient's  pain, although she does have good range of motion of her back on exam.  NEUROLOGIC:  Sensation to the bilateral upper and lower extremities is intact.     Motor strength equal and symmetric. Dorsiflexion and plantarflexio normal both     legs.    CONTINUATION BY Kristeen Mans, PA:     INITIAL ASSESSMENT AND MANAGEMENT PLAN:  This is a 26 year old female with complaints of low back pain.  Differential  includes strain versus menstrual cramps.  The patient states that she is  currently on her menstrual cycle.  She does state that pain intermittently  radiates to her abdomen.  Her abdominal exam is relatively benign,  minimal  tenderness in the suprapubic area, however, based on her symptoms, will obtain  urine.     DIAGNOSTIC INTERPRETATIONS:  Urine negative for infection, negative for blood.  Urine pregnancy negative.     ER COURSE:  Patient stable while here in the department.  Medicated for pain with Vicodin  orally.  Reassured of exam findings.  Based on her pain, will be placed on  Vicodin, Robaxin and Naprosyn.  Advised to follow up with her regular doctor  for further evaluation and management.    CLINICAL IMPRESSION AND DIAGNOSIS:  Lumbar strain.    DISPOSITION AND PLAN:  The patient discharged home.  Prescriptions and instructions as above.    Patient, of course, may return to the ED for any new or worsening symptoms.   The patient was personally evaluated by myself and Dr. Hervey Ard, who agrees  with the above assessment and plan.      ___________________  Tana Conch MD  Dictated By: Kristeen Mans, PA    My signature above authenticates this document and my orders, the final  diagnosis (es), discharge prescription (s), and instructions in the [facility]  Pulsecheck record.  AK  D:01/17/2012  T: 01/17/2012 17:22:48  [id number]  Authenticated and Edited by Tana Conch, MD On 02/10/12 10:27:34 PM

## 2012-02-15 NOTE — ED Provider Notes (Signed)
[facility]  EMERGENCY DEPARTMENT TREATMENT REPORT  NAME:  [name] [name]  SEX:   F  ADMIT: 02/14/2012  DOB:   Aug 20, 1985  MR#    [id number]  ROOM:  ER03  TIME SEEN: 08 07 PM  ACCT#  [id number]        CHIEF COMPLAINT:  Back pain.    HISTORY OF PRESENT ILLNESS:  This is a 26 year old female who states that she has had 10/10 low back pain   radiating into bilateral lower quadrants for the last 2 months.  States the   pain is constant.  She states when she gets up in the morning, she is very   stiff and it takes her a long time to get moving.  She states that bending and   twisting is what exacerbates her pain.  Nothing seems to make it better.  She   has been seen here before in the past but states she has never filled the   medications because she has not had any money.    REVIEW OF SYSTEMS:  PULMONARY:  No shortness of breath.  CARDIOVASCULAR:  No cough.    GASTROINTESTINAL:   No vomiting.  No diarrhea.  GENITOURINARY:  No dysuria.    PAST MEDICAL HISTORY:  None.    SOCIAL HISTORY:  She smokes cigarettes, drinks socially, denies drug use.    MEDICATIONS:  None.    ALLERGIES:  NONE.    PHYSICAL EXAMINATION:  VITAL SIGNS:  Blood pressure 138/79, pulse 98, respiratory rate 20,   temperature 98.6, O2 sat 98% on room air, weight 127 kg.    GENERAL APPEARANCE:  Patient appears well developed and well nourished.    Appearance and behavior are age and situation appropriate.   RESPIRATORY:  Clear and equal breath sounds.  No respiratory distress,   tachypnea, or accessory muscle use.   CARDIOVASCULAR:  Heart regular, without murmurs, gallops, rubs, or thrills.   GASTROINTESTINAL:   Her abdomen is morbidly obese, soft; there is no   tenderness throughout.  Palpation in the deep pelvis causes no discomfort.  BACK:  She has tenderness to palpation in bilateral SI joints and bilateral   paraspinal muscles.  She has no flank tenderness to percussion.  There are no   skin changes.   NEUROLOGIC:  She ambulates without difficulty. She can bend, twist, sit   without weakness.  Sensory is intact distally.    INITIAL ASSESSMENT AND MANAGEMENT:  This is a patient who has morbid obesity who likely has lumbar strain and   arthritis due to her weight.  I have encouraged her to use diet and exercise   as part of a weight loss regimen, to do gentle stretching, and to follow up   with a primary care physician as soon as possible.  She has insurance pending.    She was given a script for prednisone and Robaxin.    DIAGNOSIS:  Acute lumbosacral strain.      ___________________  Christiana Pellant MD  Dictated By: Marland Kitchen     My signature above authenticates this document and my orders, the final   diagnosis (es), discharge prescription (s), and instructions in the [facility]   Pulsecheck record.  KB  D:02/14/2012  T: 02/15/2012 01:50:41  [id number]  Authenticated by Carlis Stable. Carmela Hurt, M.D. On 02/20/2012 12:48:22 AM

## 2012-05-19 LAB — POC CHEM8
Anion gap: 13 mmol/L (ref 10–20)
BUN: 11 mg/dl (ref 7–25)
CALCIUM,IONIZED: 4.8 mg/dl (ref 4.40–5.40)
CO2, TOTAL: 32 mmol/L (ref 21–32)
Chloride: 98 mEq/L (ref 98–107)
Creatinine: 0.6 mg/dl (ref 0.6–1.3)
Glucose: 212 mg/dl — ABNORMAL HIGH (ref 74–106)
HCT: 36 % — ABNORMAL LOW (ref 38–45)
HGB: 12.2 gm/dl — ABNORMAL LOW (ref 13.0–17.2)
Potassium: 4 mEq/L (ref 3.5–4.9)
Sodium: 139 mEq/L (ref 136–145)

## 2012-05-19 NOTE — ED Notes (Signed)
Pt. States  She  Had  Dull, cramping  Pain  , rectal bleeding for 2 weeks

## 2012-05-20 NOTE — ED Provider Notes (Signed)
[facility]  EMERGENCY DEPARTMENT TREATMENT REPORT  NAME:  [name] [name]  SEX:   F  ADMIT: 05/19/2012  DOB:   1986-02-03  MR#    [id number]  ROOM:    TIME SEEN: 07 34 PM  ACCT#  [id number]    cc: Ricky Stabs     PRIMARY CARE PHYSICIAN:  Believed to be Ricky Stabs    CHIEF COMPLAINT:  Blood in stool.    HISTORY OF PRESENT ILLNESS:  This is a 27 year old female who states for the past week with every bowel   movement she has had bright red blood per rectum.  Has at least 1 to 2   episodes per day.  Her previous episodes have had bright red blood streaked   throughout the stool.  However, today she seemed to have significant amount   more with clots filling the commode.  She denies any rectal pain.  States her   stool is soft.  She has had some intermittent lower abdominal, sharp pain that   does improve after a bowel movement.  She denies any nausea, vomiting or   fevers.  No irritative voiding symptoms.  No prior history of Crohn's or   ulcerative colitis.    REVIEW OF SYSTEMS:  CONSTITUTIONAL:  No fever or chills.  RESPIRATORY:  No difficulty breathing.  CARDIOVASCULAR:  No chest pain.  GENITOURINARY:  As above.  GENITOURINARY:  No dysuria, frequency or urgency.   MUSCULOSKELETAL:  No joint pain or swelling.     PAST MEDICAL HISTORY:  None.    FAMILY HISTORY:  Noncontributory.    SOCIAL HISTORY:  Smoker.    MEDICATIONS:  None.    ALLERGIES:  NONE.    PHYSICAL EXAMINATION:  VITAL SIGNS:  Blood pressure 122/53, pulse 87, respirations 18, temperature   98.6, O2 saturation [place] 98% on room air, pain rated out of 10.  GENERAL APPEARANCE:  Patient appears well developed and well nourished.    Appearance and behavior are age and situation appropriate.  She is morbidly   obese, lying comfortably on stretcher, nontoxic appearing.  RESPIRATORY:  Lungs are clear to auscultation bilaterally.  No wheezes, rales   or rhonchi.  CARDIOVASCULAR:  Heart regular rate and rhythm.  No murmurs, rubs or gallops.   GASTROINTESTINAL:  Normal active bowel sounds in all 4 quadrants.  Abdomen is   soft.  There is no tenderness to palpation to any region.  No rebound or   guarding.  RECTAL:  With several nonthrombosed external hemorrhoids.  They are nontender   to palpation.  Stool is light brown, guaiac negative.  MUSCULOSKELETAL:  Stance and gait appear normal.   SKIN:  Warm and dry without rashes.     INITIAL ASSESSMENT AND MANAGEMENT PLAN:  A patient presenting with complaint of hematochezia and bright red blood per   rectum for the past 1 week, on examination today with several nonthrombosed   external hemorrhoids.  I suspect these are likely the source of her symptoms.    As she has had multiple episodes daily for the past 1 week and states that   she is now feeling somewhat lightheaded, today we will obtain an i-STAT chem 8   assessing for possible anemia.    DIAGNOSTIC STUDIES:  i-STAT chem 8 with blood glucose of 212, hemoglobin and hematocrit of 36 and   12.2.    COURSE IN THE EMERGENCY DEPARTMENT:  The patient remained stable throughout her stay.  Results of diagnostics were  reviewed with the patient.  She states just prior to episode of bowel movement   and bright red blood per rectum she does have some sharp, crampy pain in the   suprapubic region that is relieved with bowel movements.  However, on exam   today, her abdomen is quite soft and benign without any tenderness.  She has   no personal or family history of inflammatory bowel disease; however, at this   time, I suspect that the patient would benefit from a GI referral and   colonoscopy for further assessment.  Her hemoglobin and hematocrit are mildly   decreased at this time, but do not appear to be significant requiring   transfusion.  Additionally, her blood glucose level was found to be elevated   at 212.  She has no prior history of diabetes.  She is informed of this result   and advised that she needs to follow up with her primary care physician for    further monitoring and management of this.  At this time, her vital signs are   stable and I believe she is able to be discharged home to follow up as an   outpatient.    CLINICAL IMPRESSION AND DIAGNOSES:  1.  Evaluation of rectal bleeding.  2.  Hyperglycemia.  3.  Hemorrhoids, external without complication.    DISPOSITION AND PLAN:  The patient is discharged home in stable condition.  Instructed to follow up   with primary care physician as well as GI physician.  Given contact   information for several surrounding GI specialists.  She is given prescription   for [facility] suppositories and advised to return to the Emergency Department   at anytime for any worsening or symptoms of concern.  The patient was   personally evaluated by myself and Dr. Jannetta Quint who agrees with the   above assessment and plan.      ___________________  Lucio Edward MD  Dictated By: Zachary George. Pernell Dupre, PA-C    My signature above authenticates this document and my orders, the final   diagnosis (es), discharge prescription (s), and instructions in the [facility]   Pulsecheck record.  SC  D:05/19/2012  T: 05/20/2012 11:01:27  [id number]  Authenticated by Lucio Edward, MD On 05/30/2012 03:28:59 PM

## 2012-07-17 LAB — POC CHEM8
Anion gap: 17 mmol/L (ref 10–20)
BUN: 12 mg/dl (ref 7–25)
CALCIUM,IONIZED: 4.8 mg/dl (ref 4.40–5.40)
CO2, TOTAL: 26 mmol/L (ref 21–32)
Chloride: 99 mEq/L (ref 98–107)
Creatinine: 0.6 mg/dl (ref 0.6–1.3)
Glucose: 384 mg/dl — ABNORMAL HIGH (ref 74–106)
HCT: 39 % (ref 38–45)
HGB: 13.3 gm/dl (ref 13.0–17.2)
Potassium: 4 mEq/L (ref 3.5–4.9)
Sodium: 137 mEq/L (ref 136–145)

## 2012-07-17 LAB — GLUCOSE, POC: Glucose (POC): 345 mg/dL — ABNORMAL HIGH (ref 65–105)

## 2012-07-17 NOTE — ED Provider Notes (Signed)
[facility]  EMERGENCY DEPARTMENT TREATMENT REPORT  NAME:  [name] [name]  SEX:   F  ADMIT: 07/17/2012  DOB:   02/26/1986  MR#    [id number]  ROOM:    TIME SEEN: 05 43 PM  ACCT#  [id number]    cc: [facility]     TIME OF EVALUATION:  1453.    CHIEF COMPLAINT:  Increased thirst, polyuria, numbness.    HISTORY OF PRESENT ILLNESS:  A 27 year old female over the past month has been having increased thirst,   polyuria and intermittent episodes of paresthesias in fingers and toes.  She   has no history of diabetes, but does have a strong family history of type 2   diabetes.  Tingling discomfort in her hands and toes rated 8 out of 10.    Denies any abdominal pain, nausea, or vomiting.  No chest pain or shortness of   breath.  Occasionally feels as though she gets some hazy vision, but not   having any visual symptoms at this time.    REVIEW OF SYSTEMS:  CONSTITUTIONAL:  As above.  No weight loss or recent illness.  EYES:  As above.  No other visual symptoms.  ENT:  No URI symptoms.    RESPIRATORY:  As above.  No cough or wheezing.  ENDOCRINE:  As above.  CARDIOVASCULAR:  As above.  GASTROINTESTINAL:  As above.  GENITOURINARY:  As above.  No dysuria or urinary urgency.  NEUROLOGIC:  As above.  No motor symptoms.    PAST MEDICAL HISTORY:  Hemorrhoids.    SOCIAL HISTORY:  Tobacco use, consumes alcohol socially.  Denies recreational drug use.    FAMILY HISTORY:  Diabetes.    ALLERGIES:  NONE.    MEDICATIONS:  None.    PHYSICAL EXAMINATION:  VITAL SIGNS:  BP 137/81, pulse 93, respirations 18, temperature 98.4, pain 8,   O2 sat 98% on room air.  GENERAL APPEARANCE:  Obese female, alert.  HEENT:   Eyes:  Conjunctivae clear, lids normal.  Pupils equal, symmetrical,   and normally reactive.  Ears:  Canals and TMs clear bilaterally.    Mouth/throat:  Oral mucosa is dry, it is pink and without lesions.   RESPIRATORY:  Clear and equal breath sounds.  No respiratory distress,    tachypnea, or accessory muscle use.   CARDIOVASCULAR:  Heart regular, without murmurs, gallops, rubs, or thrills.     GASTROINTESTINAL:   Abdomen is soft, obese, nontender to palpation in all 4   quadrants.  SKIN:  Warm and dry without rashes.  NEUROLOGICAL:  Light sensation is intact throughout the upper and lower   extremities bilaterally.  No sensory deficits noted to distal aspect of the   fingers or toes.  No paresthesias noted at this time.  Upper and lower   extremity strength is symmetrical.  Nail beds are pink with prompt capillary   refill.  Radial and DP pulses brisk and symmetrical.     INITIAL ASSESSMENT AND MANAGEMENT PLAN:  The patient complaining of excessive thirst, polyuria, paresthesias to her   extremities.  She has strong family history of diabetes but no personal   history.  Blood sugar on arrival was 345.  We will obtain an i-STAT chem 8 to   assess for DKA.  Nursing chart also noted dysmenorrhea.  I specifically asked    the patient about this, and she noted her menstrual cycle started on   05/16/2012 and had been on and off  since, occurring more frequently than   normal.  However, she firmly stated that she did not wish to be evaluated for   this today.  She is much more concerned about her other issues and her   elevated blood sugar, that is what she came in for today and wanted to be   evaluated for, and she did not wish to be evaluated her dysmenorrhea on   today's visit.  I advised the patient that that was her choice; however, she   should follow up with primary care physician or her GYN for this issue in the   next week or two if it continues to persist.    DIAGNOSTIC INTERPRETATIONS:  I-STAT chem8:  Glucose 384, all other values unremarkable.    COURSE IN THE EMERGENCY DEPARTMENT:  The patient appeared very dry on initial exam, complaining of increased thirst   with elevated blood sugar, so she was hydrated with normal saline 1 liter IV    fluids.  While blood sugar was elevated, she is not in DKA and there is no   diabetic emergency.  She looks very well in the room and we do believe the   patient can safely be discharged home on oral diabetic medications.  We had   care manager Everlene Balls in to speak with patient regarding [facility] for followup as she does not have a PCP and does not have insurance.    The paperwork for the [facility]itional [facility] will be submitted.    Additionally, the patient was very interested in the [facility], so   information for this was received and information was faxed.  Furthermore,   patient was given a discounts drug card and free glucometer with test strips.    We will discharge her home on Metformin 500 mg twice daily.  She is to follow   up with Transitional [facility] Clinic within the next 2 weeks for further   management of her new onset diabetes and further evaluation of her medication.    She is to monitor her blood sugars 4 times daily, keeping a log of these   values.  She should take this log on her visit to the Transitional Care Clinic   so they are probably able to adjust her medications.  Additionally, she was   informed that her metformin can be obtained for free at [employer] drug   stores.    DIAGNOSIS:  New onset type 2 diabetes mellitus.    DISPOSITION AND PLAN:  The patient discharged home in stable condition with verbal and written   instructions for ongoing care.  She is aware she may return at any time for   new or worsening symptoms.  The patient was personally evaluated by myself and   Dr. Stephan Minister, who agrees with the above assessment and plan.      ___________________  Carin Hock MD  Dictated By: Verlin Grills, PA    My signature above authenticates this document and my orders, the final   diagnosis (es), discharge prescription (s), and instructions in the [facility]   Pulsecheck record.  JM  D:07/17/2012  T: 07/17/2012 23:51:35  [id number]   Authenticated by Nehemiah Massed. Jerolyn Center, M.D. On 08/13/2012 10:08:53 PM

## 2012-07-19 LAB — METABOLIC PANEL, COMPREHENSIVE
ALT (SGPT): 75 U/L (ref 12–78)
AST (SGOT): 52 U/L — ABNORMAL HIGH (ref 15–37)
Albumin: 3.2 gm/dl — ABNORMAL LOW (ref 3.4–5.0)
Alk. phosphatase: 91 U/L (ref 50–136)
BUN: 10 mg/dl (ref 7–25)
Bilirubin, total: 0.3 mg/dl (ref 0.2–1.0)
CO2: 25 mEq/L (ref 21–32)
Calcium: 9.1 mg/dl (ref 8.5–10.1)
Chloride: 99 mEq/L (ref 98–107)
Creatinine: 0.6 mg/dl (ref 0.6–1.3)
GFR est AA: >60
GFR est non-AA: >60
Glucose: 298 mg/dl — ABNORMAL HIGH (ref 74–106)
Potassium: 3.8 mEq/L (ref 3.5–5.1)
Protein, total: 8.1 gm/dl (ref 6.4–8.2)
Sodium: 134 mEq/L — ABNORMAL LOW (ref 136–145)

## 2012-07-19 LAB — CBC WITH AUTOMATED DIFF
BASOPHILS: 0 % (ref 0–3)
EOSINOPHILS: 1 % (ref 0–5)
HCT: 37.3 % (ref 37.0–50.0)
HGB: 12.1 gm/dl — ABNORMAL LOW (ref 13.0–17.2)
LYMPHOCYTES: 26 % — ABNORMAL LOW (ref 28–48)
MCH: 26.4 pg (ref 25.4–34.6)
MCHC: 32.5 gm/dl (ref 30.0–36.0)
MCV: 81 fL (ref 80.0–98.0)
MONOCYTES: 5 % (ref 1–13)
MPV: 7.7 fL (ref 6.0–10.0)
NEUTROPHILS: 68 % — ABNORMAL HIGH (ref 34–64)
NRBC: 0 (ref 0–0)
PLATELET: 351 10*3/uL (ref 140–450)
RBC: 4.6 M/uL (ref 3.60–5.20)
RDW: 15.2 % — ABNORMAL HIGH (ref 11.5–14.0)
WBC: 13.4 10*3/uL — ABNORMAL HIGH (ref 4.0–11.0)

## 2012-07-19 LAB — POC URINE MACROSCOPIC
Bilirubin: NEGATIVE
Glucose: 500 mg/dl — AB
Ketone: 40 mg/dl — AB
Leukocyte Esterase: NEGATIVE
Nitrites: NEGATIVE
Protein: NEGATIVE mg/dl
Specific gravity: 1.015 (ref 1.005–1.030)
Urobilinogen: 0.2 EU/dl (ref 0.0–1.0)
pH (UA): 5.5 (ref 5–9)

## 2012-07-19 LAB — GLUCOSE, POC: Glucose (POC): 354 mg/dL — ABNORMAL HIGH (ref 65–105)

## 2012-07-19 LAB — LIPASE: Lipase: 254 U/L (ref 73–393)

## 2012-07-19 NOTE — ED Provider Notes (Signed)
[facility]  EMERGENCY DEPARTMENT TREATMENT REPORT  NAME:  [name] [name]  SEX:   F  ADMIT: 07/18/2012  DOB:   08-08-1985  MR#    [id number]  ROOM:    TIME SEEN: 10 21 PM  ACCT#  [id number]        CHIEF COMPLAINT:  Thirsty, nauseated, diarrhea.    HISTORY OF PRESENT ILLNESS:  This is a 27 year old female who was just diagnosed with new onset diabetes   yesterday.  She was seen here, had a blood glucose of 384, was given IV fluids   and started on metformin.  She states her sugar this morning was 600 before   she got up.  She took her metformin, ate a meal, went back to bed.  When she   got up later it was 207.  Later this evening it was around 350, which is what   prompted her to come in.  She states she has felt tired most of the day.  She   is very thirsty, wants something to drink now.  She feels nauseated, she has   had some diarrhea.  She had no fever, no chills.  Gaynelle Adu denies any chest pain   or shortness of breath to me.  She does state she has a rash down in her   pelvic area that has been there for several days, it itches and burns.    REVIEW OF SYSTEMS:  CONSTITUTIONAL:  Positive for fatigue.  No fever, chills.   EYES:  No visual symptoms.  ENT:  Positive for increased thirst.   RESPIRATORY:  No cough, shortness of breath, or wheezing.  CARDIOVASCULAR:  No chest pain, chest pressure, or palpitations.  GASTROINTESTINAL:  Positive for nausea, positive for diarrhea.   MUSCULOSKELETAL:  No joint pain or swelling.  INTEGUMENTARY:  Complains of a rash in her genitalia area.   NEUROLOGICAL:  No headaches, sensory or motor symptoms.  Denies complaints in all other systems.    PAST MEDICAL HISTORY:  She has a history of new onset diabetes diagnosed yesterday, history of   hemorrhoids.  Last menstrual period was in February.  She denies any chance of   pregnancy.    MEDICATIONS:  She was just started on metformin yesterday.  She has only had one dose today.    ALLERGIES:  NONE KNOWN.    SOCIAL HISTORY:   Positive for tobacco, smoking cessation encouraged.  No alcohol, no street   drugs.  Here with family.      FAMILY HISTORY:  History of diabetes.    PHYSICAL EXAMINATION:  GENERAL:  A well-developed female.  VITAL SIGNS:  Blood pressure 144/80, pulse 92, respirations 18, temperature   98.5, O2 sats 98% on room air.  HEENT:  Conjunctivae are clear.  Sclerae are icteric.  Ears:  TMs are clear   bilaterally.  Mouth:  Mucous membranes a bit tacky.  Lips are dry and cracked.  NECK:  Supple, nontender, symmetrical, no masses or JVD, trachea midline,   thyroid not enlarged, nodular, or tender.  LUNGS:  Clear to auscultation, symmetrical expansion.  HEART:  Has a regular rate and rhythm.  CHEST:  Nontender.  ABDOMEN:  She has some mild epigastric tenderness.  Remainder of the abdomen   is soft and nontender.  There is no organomegaly, no masses.  BACK:  No flank tenderness.  EXTREMITIES:  Warm and dry.  NEUROLOGIC:  She is awake, alert and oriented, answering questions   appropriately.  CONTINUATION BY JULIA HUBBARD, PA-C:    PHYSICAL EXAMINATION CONTINUED:  GENITOURINARY:  Shows some macerated kind of red shiny rash in the inguinal   folds with some satellite lesions seen.  Also seen in the bilateral axillae.    INITIAL ASSESSMENT AND MANAGEMENT PLAN:  This is a 26 year old female who presents for evaluation of elevated blood   sugar with his recent diagnosis of diabetes mellitus yesterday.  At this time,   an IV was established, she was given a liter of fluid.  She was having some   epigastric pain, she feels a little nauseated, some loose stools, possibly   coming from the metformin.  We will go obtain some baseline lab work.  A urine   will be obtained, a urine pregnancy.  She will be given some Zofran for   nausea.  She does appear to have some cutaneous candidiasis and we will treat   that with some Diflucan, given a dose here.      DIAGNOSTIC STUDIES:   CMP with glucose was 298, sodium was 134, albumin was low at 3.2, lipase was   normal.  CBC with a white count of 13.4, with 68 segs and 26 lymphs.  Urine   dip showed moderate blood, 40 ketones, otherwise negative.  Urine pregnancy is   negative.      COURSE IN THE EMERGENCY DEPARTMENT:  She was feeling better.  She complained of some pins and needles feeling and   burning on the soles of her feet.  Discussed this will need follow up.  We   will have her take her metformin dose now.  She has an appointment to follow   up with the dietitian tomorrow.  She will keep that.  She already has an   appointment to follow up with [facility] which she will keep.  I   have written for some Nystatin powder for the yeast.  Discussed continuing to   record her blood sugars.  With regards to the pins and needles feeling in her   feet, she will need follow up with that as well and she will discuss that   with the Transitional [facility] Clinic.  Certainly seek medical attention if new or   worsening symptoms.  The patient was advised to follow up with regards to   blood in her urine and have that rechecked as well.      FINAL DIAGNOSES:  1.  Acute hyperglycemia, new onset diabetes mellitus 2.  2.  Cutaneous candidiasis.      DISPOSITION AND PLAN:  The patient was discharged home in stable condition with family to follow up   as above.  The patient was examined by myself and Dr. Orma Flaming who   agrees with above assessment and plan.      ___________________  Posey Pronto MD  Dictated By: Maurice Small. Williams Che, [place]    My signature above authenticates this document and my orders, the final   diagnosis (es), discharge prescription (s), and instructions in the [facility]   Pulsecheck record.  DS  D:07/18/2012  T: 07/19/2012 01:38:16  [id number]  Authenticated by Posey Pronto, M.D. On 07/22/2012 05:30:15 PM

## 2012-07-25 NOTE — ED Notes (Signed)
Emergency provider consulted with the Care Management to connect patient with primary provider, patient will be directed to [facility], and patient will receive the verification of income for sliding fee eligibility form, for Medical and Dental Services facility has a case worker to give  patient the opportunity to discuss insurance option in the Marketplace

## 2012-08-02 LAB — HEMOGLOBIN A1C WITH EAG: Hemoglobin A1c: 10.7 % — ABNORMAL HIGH (ref 4.8–6.0)

## 2012-08-03 NOTE — H&P (Signed)
[facility]  CONSULTATION REPORT  NAME:  [name], [name]  SEX:   F  ADMIT: 08/02/2012  DATE OF CONSULT: 08/02/2012  REFERRING PHYSICIAN:    DOB: Jan 09, 1986  MR#    [id number]  ROOM:    ACCT#  [id number]        CHIEF COMPLAINT:  This is the first transitional care clinic appointment following up this  27 year old morbidly obese African American female who had an emergency room  visit at [facility] on 07/17/12 for increased thirst, polyuria and numbness.  She   was  having a month's worth of these symptoms and also parathesias in her fingers  and toes.  She was diagnosed with new onset type 2 diabetes.  Her i-STAT   showed  a glucose of 384.  All other values unremarkable.  She was started on   metformin  500 mg p.o. b.i.d. and instructed to check her sugars 4 times a day.    HISTORY OF PRESENT ILLNESS:  The patient comes in today with the main complaint of rash between her legs   and under her breasts.  Her sugars have been all in the 300-400 range.    She has been taking her metformin as ordered 500 mg b.i.d. She denies vaginal      discharge, but does complain of some burning on urination. She has tried one    over the counter medication for a yeast infection that she used for 3 days    that did not change any of her symptoms. She is sexually active with females    only.  Monogamous with one female for the last year. Her partner has no    symptoms at this time of yeast infection or rashes.       ALLERGIES:  NONE.    PAST MEDICAL HISTORY:  Hemorrhoids.    SURGICAL HISTORY:  None.    PSYCHIATRIC HISTORY:  None.    SCREENING TESTS:  Last eye exam 2012.  Last dental exam 2013.  Last physical 2013.    PERSONAL SOCIAL HISTORY:  She is married.  She works as a Investment banker, operational.  She has an Associates degree.  Tobacco   3  to 4 cigarettes per day.  Alcohol none.  Drugs:  Denies.    FAMILY HISTORY:  Mother has diabetes and hypertension.  Siblings alive and well.    REVIEW OF SYSTEMS:   GENERAL:  She has lost 25 pounds over the past 60 days.  SKIN:  Rash as described in HPI red and itchy under her breasts and in the  groin area.  She also had some under her arm which is resolved.  HEAD:  Denies headache.  EYES:  Blurry vision which is worse in the morning over the last several   months.  EARS:  Denies hearing loss or ringing in the ears.  NOSE:  No stuffiness, itching, postnasal drip.  THROAT AND MOUTH:  Denies tooth pain.  Has had some dry mouth since starting  metformin.  NECK:  Denies stiffness lumps or goiters.  BREASTS:  Rashes described.  RESPIRATORY:  Denies cough or shortness of breath.  CARDIOVASCULAR:  Reports of some chest pain across her chest that is relieved  with ibuprofen.    GASTROINTESTINAL:  Having some nausea and 4 to 5 watery brown stools a day  since starting metformin.  Denies constipation.  URINARY:  Has had 3 weeks of burning on urination.  VASCULAR:  Denies cramping, swelling, calf.  MUSCULOSKELETAL:  Denies  muscle joint or back pain.  NEUROLOGIC:  Complained of fingers and toes feeling like pins and needles.  HEMATOLOGIC:  Denies easy bleeding.    PHYSICAL EXAMINATION:  APPEARANCE:  A 27 year old [place] American female, morbidly obese.  VITAL SIGNS:  Temperature 97.5, heart rate 92, respiratory rate 20, blood  pressure 123/68, O2 saturation 96%.  HEENT:  Red rash with papules, mild under left breast, severe with open areas  both groins and back towards anus.  Feet fine lacy white covering of both feet  with heels cracked.  Eyes PERRLA. Ears:  TMs normal.  Throat and mouth:   Dentition intact.  NECK:  Supple, no JVD, no masses.  LYMPH NODES:  No cervical.  LUNGS:  Clear.  CARDIOVASCULAR:  S1 and S2 and regular.  ABDOMEN:  Soft, large, nondistended, no masses.  PERIPHERAL VASCULAR:  No peripheral edema.  MUSCULOSKELETAL:  No joint deformities.  NEUROLOGICAL:  Alert, relaxed, cooperative.  Thought process coherent.    ASSESSMENT:   1.  New diagnosis diabetes type 2.  Hemoglobin A1c drawn today.  Metformin  increased to 1000 mg p.o. b.i.d., #60, refill times 1 given to the patient,  soft stools probably secondary to metformin.  Urged the patient to continue,  diarrhea likely to improve.  If it gets worse, can stop metformin, but call   the clinic first.  Start Glipizide 5 mg p.o. b.i.d., #60, refill times 1.   Discussed that the patient may need insulin, but she declines insulin at this   time so will try p.o. medications. 2.  Intertrigo, Nystatin/triamcinolone   cream AAA b.i.d. 30 gram tube #1 no refill.  Instructed to avoid mucous   membranes of vaginal area, can also be used under the breast or under the arms     for the rash. 3.  Vaginal yeast infection, over the counter 7-day yeast   treatment. Instructed the patient to purchase. 4.  Athlete's foot, soak feet   in warm water and white vinegar daily for 20 minutes for a week, after soak   dry feet thoroughly in between toes and apply athlete's foot cream purchased   over the counter.  Recommended clotrimazole cream.  Instructed the patient   that we are going to bring her sugars down and work on her yeast infections   all at the same time. 5.  Smoking.  Encouraged the patient to quit. 6.  Filled     out information for [facility] and instructed how to be a patient there.  7.  Followup in this clinic 2 weeks.  Encouraged to call with any questions.      ___________________  Wendee Copp NP  Dictated By:.   [facility]  D:08/02/2012  T: 08/03/2012 17:45:23  [id number]  Authenticated and Edited by Wendee Copp, NP On 08/16/12 11:09:21 AM

## 2012-08-06 NOTE — ED Notes (Signed)
REITERATING: patient will be directed to [facility], and patient will receive the verification of income for sliding fee eligibility form, for Medical and Dental Services facility has a case worker to give  patient the opportunity to discuss insurance option in the Marketplace

## 2012-08-10 NOTE — Progress Notes (Signed)
[facility]  Transitional Care Clinic Progress Note  NAME:  [name] [name], [name] [name]  DATE OF BIRTH:1985/09/28  DATE: 08/10/2012  SEX: F  REFERRING PHYSICIAN:    MR# [id number]  LOCATION: Transitional Care Clinic  BILLING#  [id number]        CHIEF COMPLAINT:  This is the second Transitional Care Clinic appointment following up an  emergency room visit at [facility] on 07/17/2012 with a new diagnosis of diabetes.    HISTORY OF PRESENT ILLNESS:  Since the last visit, the patient is taking an increased dose of her Metformin  1000 mg b.i.d. and Glipizide 5 mg 1 p.o. b.i.d. was added.  Her diarrhea  initially increased and then resolved.  Her blood sugars range from 183 to   250.   She has been checking sugars twice a day.  Her vaginal yeast infection,    intertrigo and athlete's foot have all either resolved or improved.  The rash      under her breasts is gone.  The vaginal yeast infection is gone.  The    groin rash is gone.  Her athlete's foot is much improved after using    athlete's foot treatment.  She has been having heavy periods for years and    had especially bad menstrual cramps with this last period since she was seen    last and over-the-counter NSAIDs did not help.  She continues to smoke.  She    has been trying to walk more and cut back on carbohydrates and sugars.  She    feels much better since starting on diabetes treatment with less blurry    vision and less numbness and tingling in her feet.      PHYSICAL EXAMINATION:   GENERAL:  A 27 year old morbidly obese African American female with a friend.  CARDIOVASCULAR:  S1 and S2, regular.  LUNGS:  Clear.  GASTROINTESTINAL:  Bowel sounds times 4 in all quadrants, nontender abdomen.    ASSESSMENT AND PLAN:  1.  Diabetes type 2.  Continue medications as ordered: metformin 1000 b.i.d.,   Glipizide 5 mg b.i.d.  The patient has enough refills to get her to [facility].  Her A1c came back 10.7, the goal is 7.   2.  Menorrhagia with dysmenorrhea.  Prescription for Motrin 400 mg 2 p.o.  t.i.d. given to the patient, #90, no refill.  Instructed to not go over 800 mg  t.i.d.  3.  Intertrigo and vaginal yeast infection, resolved.  4.  Athlete's foot improved.  Encouraged to continue treatment.  5.  Tobacco abuse.  Encouraged to quit.  6.  Weight loss.    7.  Reviewed lifestyle changes regarding all of the above.  We discussed   medications used for diabetes, but most of them besides metformin cause weight     gain, so the patient chooses to continue working on her lifestyle changes.    But if her sugars do not come down under 200 and more in the 140 range, we   talked about her needing insulin or at least to add other medications, and she     states understanding.  Her appointment at [facility] is 08/15/2012.  Will     follow up here in this clinic one more time in 2 weeks.      ___________________  Wendee Copp NP  Dictated By:.   Edmonia Caprio  D:08/10/2012  T: 08/10/2012 16:29:11  [id number]  Authenticated and Edited by Wendee Copp, NP On  08/16/12 11:38:07 AM

## 2012-08-23 NOTE — Progress Notes (Signed)
[facility]  Transitional Care Clinic Progress Note  NAME:  [name] [name], [name] [name]  DATE OF BIRTH:1985/08/26  DATE: 08/23/2012  SEX: F  REFERRING PHYSICIAN:    MR# [id number]  LOCATION: Transitional Care Clinic  BILLING#  [id number]        CHIEF COMPLAINT:  This is the third transitional care clinic appointment for this 27 year old   morbidly obese African American female for an emergency room visit 07/17/2012   with new diagnosis of diabetes.    HISTORY OF PRESENT ILLNESS:    We are following the patient for control of her blood sugars, also intertrigo.    The patient states her sugars are running between 85 and 113.  She has had   no low blood sugar reactions.  She is taking her medications as ordered 1000   mg metformin b.i.d., 5 mg glipizide b.i.d.  She has enough refills on those.    Her A1c the last visit was 10.7.  Her intertrigo has resolved.  She has no   further vaginal itching and completed her over over-the-counter vaginal yeast   infection.  She continues to smoke, although she says she is cutting back.    Her athlete's foot is improved.  She complained last visit of menorrhagia with   dysmenorrhea.  We recommended high dose ibuprofen for that and the patient   states her  period came on again and she tried that and it was improved.    PHYSICAL EXAMINATION:  GENERAL:  Well-developed, well-nourished, obese black female in no apparent   distress.  LUNGS:  Clear.  CARDIOVASCULAR:  S1 and S2 regular.  Her skin is warm, dry and intact.  Her    foot exam is improved.  Continue that treatment.  VITAL SIGNS:  Temperature 98.4, pulse 84, respiratory rate 20, blood pressure   118/62, O2 saturation 98%, weight 320.4 pounds.  This is down 5 pounds in 2   weeks.    ASSESSMENT AND PLAN:  1.  Diabetes.  Continue current medications 1000 mg metformin b.i.d. and   Glipizide 5 mg 1 p.o. b.i.d. The patient has enough refills to get her to her   next appointment.   2.  Athlete's foot.  Continue over the counter athlete's foot cream and   monitor under her breasts and under her arms for future intertrigo as   discussed as her sugar is controlled, so will her control of all of her yeast   infections.  3.  Menorrhagia with dysmenorrhea.  Continue over the counter ibuprofen as   needed as ordered.  Follow up for GYN appointment at [facility].  4.  Tobacco abuse.  Encouraged to quit.    5.  Weight loss.  Congratulated on her 5 pounds of weight loss and continue   lifestyle changes.  Follow up at [facility];  her first appointment is   09/12/2012 (that is her first medical appointment)  and if she has any   questions prior to that time, she is encouraged to call this clinic.      ___________________  Wendee Copp NP  Dictated By:.   zga  D:08/23/2012  T: 08/23/2012 13:41:30  [id number]  Authenticated by Wendee Copp, NP On 09/13/2012 04:28:11 PM

## 2012-10-17 LAB — POC CHEM8
Anion gap: 16 mmol/L (ref 10–20)
BUN: 14 mg/dl (ref 7–25)
CALCIUM,IONIZED: 4.6 mg/dl (ref 4.40–5.40)
CO2, TOTAL: 28 mmol/L (ref 21–32)
Chloride: 99 mEq/L (ref 98–107)
Creatinine: 0.7 mg/dl (ref 0.6–1.3)
Glucose: 163 mg/dl — ABNORMAL HIGH (ref 74–106)
HCT: 39 % (ref 38–45)
HGB: 13.3 gm/dl (ref 13.0–17.2)
Potassium: 4.8 mEq/L (ref 3.5–4.9)
Sodium: 137 mEq/L (ref 136–145)

## 2012-10-17 LAB — POC URINE MACROSCOPIC
Bilirubin: NEGATIVE
Blood: NEGATIVE
Glucose: NEGATIVE mg/dl
Ketone: NEGATIVE mg/dl
Leukocyte Esterase: NEGATIVE
Nitrites: NEGATIVE
Protein: NEGATIVE mg/dl
Specific gravity: 1.01 (ref 1.005–1.030)
Urobilinogen: 0.2 EU/dl (ref 0.0–1.0)
pH (UA): 6 (ref 5–9)

## 2012-10-17 LAB — POC HCG,URINE: HCG urine, QL: NEGATIVE

## 2012-10-17 NOTE — ED Provider Notes (Signed)
[facility]  EMERGENCY DEPARTMENT TREATMENT REPORT  NAME:  [name] [name]  SEX:   F  ADMIT: 10/17/2012  DOB:   [birth year]  MR#    [id number]  ROOM:    TIME SEEN: 10 01 AM  ACCT#  [id number]    cc: [facility]     PRIMARY CARE PHYSICIAN:  [facility]    TIME OF EVALUATION:  7:55 a.m.    CHIEF COMPLAINT:  Nausea, vomiting, diarrhea.    HISTORY OF PRESENT ILLNESS:  The patient is a 27 year old female who says she woke up around 3 a.m. this   morning with nausea, began vomiting and having watery diarrhea.  She denies   any hematemesis, no melena or hematochezia.  No abdominal pain, no fevers or   chills, no prior belly surgeries.  She is a diabetic.  She states it is fairly   well controlled.  Her blood sugar this morning was around 216.  She says that   is actually pretty high for her.  It is usually much lower than that.  She is   managed on oral hypoglycemic agents.  The patient has no other medical   history.  Stating she is coming in because she just cannot keep anything down   and is quite miserable at home.    REVIEW OF SYSTEMS:  CONSTITUTIONAL:  No fevers or chills.  RESPIRATORY:  No shortness of breath.  CARDIOVASCULAR:  No chest pain.  GASTROINTESTINAL:  No abdominal pain.  Positive nausea, vomiting, diarrhea.  GENITOURINARY:  No dysuria, frequency.  MUSCULOSKELETAL:  No extremity pain.  INTEGUMENTARY:  No rashes.  NEUROLOGIC:  No headache.    PAST MEDICAL HISTORY:  Significant for non-insulin dependent diabetes.    FAMILY HISTORY:  Noncontributory.    SOCIAL HISTORY:  She occasionally smokes and is a social drinker, denies illicit drug use.    ALLERGIES:  NO KNOWN DRUG ALLERGIES.    MEDICATIONS:  Reviewed in Ibex.    PHYSICAL EXAMINATION:  VITAL SIGNS:  Blood pressure 120/62, respirations 16, pulse 80, temperature   98.4, sats 98% on room air.  GENERAL APPEARANCE:  Patient appears well developed and well nourished.     Appearance and behavior are age and situation appropriate.  The patient is   lying on her side on the stretcher, but sits up to talk to me eagerly when I   walk in the room.  A pleasant 27 year old not acutely distressed.    HEENT:  ENT:  Oral mucosa is pink and slightly tacky.  Teeth and gums   unremarkable.  NECK:  Supple, symmetrical.  Trachea is midline.  RESPIRATORY:  Clear and equal breath sounds.  No respiratory distress,   tachypnea, or accessory muscle use.  CARDIOVASCULAR:   Heart regular, without murmurs, gallops, rubs, or thrills.    GASTROINTESTINAL:  Abdomen is soft.  No tenderness at all appreciated to   palpation.    MUSCULOSKELETAL: Stance and gait appear normal.    SKIN:  Warm and dry.  NEUROLOGICAL:  She is alert and oriented times 3.    INITIAL ASSESSMENT AND MANAGEMENT PLAN:  This is a 27 year old female coming in for evaluation of nausea, vomiting and   diarrhea, nonbloody.  No reported hematemesis or hematochezia or melena.  The   patient has no fevers, no abdominal tenderness elicited on exam and no   reported abdominal pain.  Because of her history of diabetes, I will check an   i-STAT to make  sure her electrolytes are okay and there are no signs of   diabetic ketoacidosis (DKA) and treat her symptomatically.  Also, would like   to check a urine and urine pregnancy to make sure she is not vomiting because   she is pregnant.    DIAGNOSTIC INTERPRETATION:  Urine pregnancy negative.  Urinalysis is completely unremarkable.  ISTAT   showed a glucose of 163, otherwise, unremarkable with normal electrolytes.    COURSE IN THE EMERGENCY DEPARTMENT:  The patient was given a liter of IV hydration, 4 of IV Zofran.  On   reevaluation, she says her nausea was completely gone.  She was feeling much   better and willing to go home to finish recovering on antiemetic medication.    DIAGNOSES:  1.  Nausea and vomiting.  2.  Diarrhea.    DISPOSITION:   She is discharged in stable condition with prescription for ODT Zofran with   vomiting and diarrhea instructions.  To take over-the-counter Imodium as   needed for diarrhea.  To drink lots of fluids for hydration and return with   new or worsening concerns and to follow up with [facility] primary   care physician.  The patient was personally evaluated by myself and Dr.   Orma Flaming who agrees with the above assessment and plan.      ___________________  Posey Pronto MD  Dictated By: Bettey Mare. Earlene Plater, PA-C    My signature above authenticates this document and my orders, the final   diagnosis (es), discharge prescription (s), and instructions in the [facility]   Pulsecheck record.    If you have any questions please contact [phone].    NT  D:10/17/2012 10:01:31  T: 10/17/2012 11:44:50  [id number]  Authenticated by Posey Pronto, M.D. On 10/21/2012 12:10:01 AM

## 2012-12-26 LAB — POC CHEM8
Anion gap: 13 mmol/L (ref 10–20)
BUN: 11 mg/dl (ref 7–25)
CALCIUM,IONIZED: 4.3 mg/dl — ABNORMAL LOW (ref 4.40–5.40)
CO2, TOTAL: 26 mmol/L (ref 21–32)
Chloride: 102 mEq/L (ref 98–107)
Creatinine: 0.9 mg/dl (ref 0.6–1.3)
Glucose: 143 mg/dl — ABNORMAL HIGH (ref 74–106)
HCT: 38 % (ref 38–45)
HGB: 12.9 gm/dl — ABNORMAL LOW (ref 13.0–17.2)
Potassium: 3.6 mEq/L (ref 3.5–4.9)
Sodium: 137 mEq/L (ref 136–145)

## 2012-12-26 LAB — POC URINE MACROSCOPIC
Bilirubin: NEGATIVE
Blood: NEGATIVE
Glucose: NEGATIVE mg/dl
Ketone: NEGATIVE mg/dl
Leukocyte Esterase: NEGATIVE
Nitrites: NEGATIVE
Protein: NEGATIVE mg/dl
Specific gravity: >=1.03 (ref 1.005–1.030)
Urobilinogen: 1 EU/dl (ref 0.0–1.0)
pH (UA): 6 (ref 5–9)

## 2012-12-26 LAB — GLUCOSE, POC
Glucose (POC): 149 mg/dL — ABNORMAL HIGH (ref 65–105)
Glucose (POC): 185 mg/dL — ABNORMAL HIGH (ref 65–105)

## 2012-12-26 LAB — POC HCG,URINE: HCG urine, QL: NEGATIVE

## 2012-12-26 NOTE — ED Provider Notes (Signed)
[facility]  EMERGENCY DEPARTMENT TREATMENT REPORT  NAME:  [name] [name]  SEX:   F  ADMIT: 12/26/2012  DOB:   1985/08/28  MR#    [id number]  ROOM:    TIME SEEN: 06 34 AM  ACCT#  [id number]        TIME OF EVALUATION:  0630    [facility]    CHIEF COMPLAINT:  Elevated blood glucose.    HISTORY OF PRESENT ILLNESS:  This is a 27 year old female who was recently diagnosed with type 2 diabetes   last April, placed on metformin and glipizide.  States she woke up this   morning and checked her blood sugar and it was elevated at approximately above   400.  She had taken her medication soon after she checked her blood sugar and   then had her friend drive her to the Emergency Department.  She states that   over the past couple of days, she has been having some frequency of urination   as well as increased thirst.  She also developed a headache today that she   says is a dull, achy-type of pain in the front of her head.  She rates it as a   10 out of 10 with some sensitivity to light.  States she has never had any   history of migraines before.  She takes Tylenol for headaches and says that   works for her.  She denies any numbness or tingling to any of her extremities   or any trauma to her head.  She also tells me that she was having some blurry   vision.    REVIEW OF SYSTEMS:  CONSTITUTIONAL:  No fever, chills, or weight loss.   RESPIRATORY:  No cough, shortness of breath, or wheezing.   CARDIOVASCULAR:  No chest pain, chest pressure, or palpitations.  GENITOURINARY: Positive for frequency.  NEUROLOGICAL:  Positive for headache.    PAST MEDICAL HISTORY:  The patient is a recently newly diagnosed type 2 diabetic last April.  No   other pertinent past medical or surgical history.    FAMILY HISTORY:  Noncontributory.    SOCIAL HISTORY:  The patient smokes cigarettes, drinks alcohol occasionally and denies illicit   drug use.    ALLERGIES:   NO KNOWN DRUG ALLERGIES    MEDICATIONS:   Currently takes 5 mg of glipizide 1000 twice today, 1000 mg of Metformin 2   times today as well as Neurontin.    PHYSICAL EXAMINATION:  VITAL SIGNS:  Her blood pressure is 135/82, pulse 79, respirations 18,   temperature 98, she is saturating at 99% on room air.  CONSTITUTIONAL:  This is an obese 27 year old female lying on the bed.  She   does not appear to be in any acute discomfort.  EYES:  Conjunctivae clear, lids normal.  Pupils equal, symmetrical, and   normally reactive.    RESPIRATORY:  Clear and equal breath sounds.  No respiratory distress,   tachypnea, or accessory muscle use.     CARDIOVASCULAR:   Heart regular, without murmurs, gallops, rubs, or thrills.     DP pulses 2+ and equal bilaterally.   GASTROINTESTINAL:   Abdomen soft, nontender, without complaint of pain to   palpation.  No hepatomegaly or splenomegaly.    SKIN:  Warm and dry without rashes.     ADDENDUM BY JEFFREY PADGETT, PA-C:     PHYSICAL EXAMINATION ADDITION:  EYES:  Her visual acuity is  20/20 both eyes.  The patient does not wear   glasses or contacts.      CONTINUATION BY JEFFREY PADGETT, PA-C:     IMPRESSION AND PLAN:  This is a 27 year old female who presents for evaluation of hyperglycemia,   newly diagnosed diabetic on glipizide and metformin.  Blood sugar this morning   was in excess of 400.  This is an acute exacerbation of a chronic condition   for this patient.  The patient's history was discussed with family members.    No additional relevant information was obtained.  Old records were reviewed.    No additional relevant information was obtained.  Nursing notes were reviewed.    We will obtain a blood sugar in the Emergency Department.  We will also   obtain an i-STAT, urinalysis, UPT as well as medicate the patient with Ativan,   diphenhydramine IV for her headache, IV Zofran for the nausea and IV normal   saline for rehydration.  Her initial blood glucose was 149.  Her i-STAT showed    a blood sugar of 143, calcium 4.3, hemoglobin 12.9.  UPT was negative.    Urinalysis was unremarkable.  Her final blood sugar check after she was   rehydrated and had something to eat was 185.  We will also plan on having her   increase her glipizide until her followup appointment on the 09/22.    DIAGNOSES:   1.  Diabetes mellitus type 2.  2.  Hyperglycemia.  3.  Dehydration.  4.  Headache.    DISPOSITION:  The patient was discharged home in stable condition.  She was advised to take   10 mg of glipizide in the morning at 5 mg glipizide in the evening.  Follow up   as schedule on 09/22 for possible readjustment of her medication.  Return to   the Emergency Department if she had any worsening symptoms, increased blood   sugar.  The patient was agreeable with this plan.  The patient was seen and   evaluated by myself and Dr. Arvella Merles who agrees with the above assessment and   plan.      ___________________  Smitty Cords MD  Dictated By: Fransisca Kaufmann, PA-C    My signature above authenticates this document and my orders, the final   diagnosis (es), discharge prescription (s), and instructions in the [facility]   Pulsecheck record.    If you have any questions please contact [phone].    DE  D:12/26/2012 06:34:44  T: 12/26/2012 11:05:50  [id number]  Authenticated by Smitty Cords, M.D. On 12/26/2012 03:54:43 PM

## 2013-01-27 LAB — POC URINE MACROSCOPIC
Bilirubin: NEGATIVE
Blood: NEGATIVE
Glucose: 100 mg/dl — AB
Ketone: NEGATIVE mg/dl
Leukocyte Esterase: NEGATIVE
Nitrites: NEGATIVE
Protein: NEGATIVE mg/dl
Specific gravity: 1.025 (ref 1.005–1.030)
Urobilinogen: 0.2 EU/dl (ref 0.0–1.0)
pH (UA): 6 (ref 5–9)

## 2013-01-27 LAB — POC CHEM8
Anion gap: 20 mmol/L (ref 10–20)
BUN: 8 mg/dl (ref 7–25)
CALCIUM,IONIZED: 4.8 mg/dl (ref 4.40–5.40)
CO2, TOTAL: 27 mmol/L (ref 21–32)
Chloride: 96 mEq/L — ABNORMAL LOW (ref 98–107)
Creatinine: 0.8 mg/dl (ref 0.6–1.3)
Glucose: 227 mg/dl — ABNORMAL HIGH (ref 74–106)
HCT: 41 % (ref 38–45)
HGB: 13.9 gm/dl (ref 13.0–17.2)
Potassium: 3.7 mEq/L (ref 3.5–4.9)
Sodium: 139 mEq/L (ref 136–145)

## 2013-01-27 LAB — POC HCG,URINE: HCG urine, QL: NEGATIVE

## 2013-01-28 NOTE — ED Provider Notes (Signed)
[facility]  EMERGENCY DEPARTMENT TREATMENT REPORT  NAME:  [name] [name]  SEX:   F  ADMIT: 01/27/2013  DOB:   09/26/1985  MR#    [id number]  ROOM:    TIME DICTATED: 03 08 PM  ACCT#  [id number]    cc: [facility]     PRIMARY CARE PHYSICIAN:  [facility]     EVALUATION TIME:  1440    CHIEF COMPLAINT:  Hyperglycemia.    HISTORY OF PRESENT ILLNESS:  A 27 year old female, comes in saying her blood sugar readings for the past   month have been high in the 300s and 400s.  She has had some blurry vision and   increased thirst, increased urination without dysuria. No fevers, chills. No   vomiting, no chest pain.  The patient states that she was seen here in   September, treated and discharged.  She has followed up at the clinic, but   there has been no adjustment to her medications.  They have set her up for   outpatient ophthalmology appointment.  She has been taking her medications as   prescribed which include glipizide 5 mg 2 times a day and metformin 1000 mg 2   times a day.    REVIEW OF SYSTEMS:  CONSTITUTIONAL:  Has not had fevers or chills.  EYES:  Visual blurring occasionally.  No eye pain.  ENT: Mouth feels dry.  RESPIRATORY:  No cough, shortness of breath, or wheezing.   CARDIOVASCULAR:  No chest pain, chest pressure, or palpitations.   GASTROINTESTINAL:  No vomiting, diarrhea, or abdominal pain.   ENDOCRINE:  Increased thirst, increased urination.  GI:  Frequency.  No dysuria.  MUSCULOSKELETAL:  No joint pain or swelling.   INTEGUMENTARY:  No rashes.   NEUROLOGICAL:  Fatigue.  Denies headaches.    PAST MEDICAL HISTORY:  Diabetes.    SOCIAL HISTORY:  She is a nonsmoker.    FAMILY HISTORY:  Not known.    ALLERGIES:  NONE.    MEDICATIONS:  Listed and reviewed in Ibex.    PHYSICAL EXAMINATION:  GENERAL APPEARANCE:  Patient appears well developed and well nourished.    Appearance and behavior are age and situation appropriate.    VITAL SIGNS:  Blood pressure 153/68, pulse 99, respirations 20, temperature   97.7, no pain, 97% on room air.  HEENT:  Pupils equal, symmetrical and normally reactive.    ENT:  Mucous membranes look a  little dry and nonerythematous.  NECK:  Supple, no adenopathy.  RESPIRATORY:  Clear and equal breath sounds.  No respiratory distress,   tachypnea, or accessory muscle use.     CARDIOVASCULAR:   Heart regular, without murmurs, gallops, rubs, or thrills.       GI:  Abdomen soft, nontender, without complaint of pain to palpation.  No   hepatomegaly or splenomegaly.   MUSCULOSKELETAL: Nails:  No clubbing or deformities.  Nailbeds pink with   prompt capillary refill.   SKIN:  Warm and dry without rashes.   NEUROLOGIC:  Alert, oriented.  Sensation intact, motor strength equal and   symmetric.   PSYCHIATRIC: Recent and remote memory appear to be intact.     CONTINUATION BY ERIC ICENBICE, PA    ASSESSMENT AND MANAGEMENT PLAN:  A patient here with hyperglycemia and intermittent high sugars at home.  She   has been feeling fatigued and had symptoms of hyperglycemia.  We will check    an I-stat, start hydrating and check a  urine.     DIAGNOSTIC  INTERPRETATION:  I-STAT:  Actually glucose is 227.  The patient's potassium and sodium are   normal.  She has no gap.  Her CO2 is normal.  The patient's urine showed 100   glucose, but was negative for infection.  Normal specific gravity, negative   for ketones.  Pregnancy test was negative.    COURSE IN EMERGENCY DEPARTMENT:  The patient was given a fluid bolus on a liter.  She was taking her p.o. meds   at home.  I have advised since her sugars have been erratic that she keep a   long and follow up with the clinic as an outpatient to see if they can better   manage her blood sugars.    DIAGNOSES:  1.  Hyperglycemia.  2.  Fatigue.    DISPOSITION:  Home.  The patient was personally evaluated by myself and Dr. Jama Flavors who    agrees with the above assessment and plan.  The patient is discharged home in   stable condition, with instructions to follow up with their regular doctor.    They are advised to return immediately for any worsening or symptoms of   concern.      ___________________  Imogene Burn M.D.  Dictated By: Marlana Salvage, PA    My signature above authenticates this document and my orders, the final  diagnosis (es), discharge prescription (s), and instructions in the [facility]   Pulsecheck record.  Nursing notes have been reviewed by the physician/mid-level provider.    If you have any questions please contact [phone].    PB  D:01/27/2013 15:08:29  T: 01/28/2013 00:50:57  [id number]  Authenticated by Gerlene Burdock L. Genni Buske, M.D. On 01/30/2013 05:29:03 PM
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
## 2013-03-18 LAB — BLOOD GAS, ARTERIAL
BASE EXCESS: -2 mmol/L (ref <=3)
BASE EXCESS: 0 mmol/L (ref <=3)
BICARBONATE: 22.5 mmol/L (ref 22.0–26.0)
BICARBONATE: 24 mmol/L (ref 22.0–26.0)
CO2, TOTAL: 24 mmol/L (ref 21–32)
CO2, TOTAL: 25 mmol/L (ref 21–32)
O2 SAT: 93.7 % — ABNORMAL HIGH (ref 70–75)
O2 SAT: 99.6 % — ABNORMAL HIGH (ref 70–75)
PCO2: 37.9 mm Hg — ABNORMAL LOW (ref 41.0–51.0)
PCO2: 38.5 mm Hg — ABNORMAL LOW (ref 41.0–51.0)
PO2: 184 mm Hg — ABNORMAL HIGH (ref 35–40)
PO2: 71 mm Hg — ABNORMAL HIGH (ref 35–40)
Performed by: 42804
Performed by: 42804
pH: 7.375 (ref 7.310–7.410)
pH: 7.41 (ref 7.310–7.410)

## 2013-03-18 LAB — CBC WITH AUTOMATED DIFF
ABS. BASOPHILS: 0 10*3/uL (ref 0.0–0.1)
ABS. EOSINOPHILS: 0.1 10*3/uL (ref 0.0–0.4)
ABS. LYMPHOCYTES: 4.3 10*3/uL — ABNORMAL HIGH (ref 0.9–3.6)
ABS. MONOCYTES: 0.6 10*3/uL (ref 0.05–1.2)
ABS. NEUTROPHILS: 4.7 10*3/uL (ref 1.8–8.0)
BASOPHILS: 0 % (ref 0–2)
EOSINOPHILS: 1 % (ref 0–5)
HCT: 41 % (ref 35.0–45.0)
HGB: 13.7 g/dL (ref 12.0–16.0)
LYMPHOCYTES: 44 % (ref 21–52)
MCH: 27.6 PG (ref 24.0–34.0)
MCHC: 33.4 g/dL (ref 31.0–37.0)
MCV: 82.5 FL (ref 74.0–97.0)
MONOCYTES: 6 % (ref 3–10)
MPV: 9.5 FL (ref 9.2–11.8)
NEUTROPHILS: 49 % (ref 40–73)
PLATELET: 339 10*3/uL (ref 135–420)
RBC: 4.97 M/uL (ref 4.20–5.30)
RDW: 14.5 % (ref 11.6–14.5)
WBC: 9.7 10*3/uL (ref 4.6–13.2)

## 2013-03-18 LAB — METABOLIC PANEL, COMPREHENSIVE
A-G Ratio: 0.8 (ref 0.8–1.7)
ALT (SGPT): 83 U/L — ABNORMAL HIGH (ref 13–56)
AST (SGOT): 44 U/L — ABNORMAL HIGH (ref 15–37)
Albumin: 3.6 g/dL (ref 3.4–5.0)
Alk. phosphatase: 94 U/L (ref 45–117)
Anion gap: 11 mmol/L (ref 3.0–18)
BUN/Creatinine ratio: 15 (ref 12–20)
BUN: 8 MG/DL (ref 7.0–18)
Bilirubin, total: 0.3 MG/DL (ref 0.2–1.0)
CO2: 24 mmol/L (ref 21–32)
Calcium: 9.2 MG/DL (ref 8.5–10.1)
Chloride: 98 mmol/L — ABNORMAL LOW (ref 100–108)
Creatinine: 0.55 MG/DL — ABNORMAL LOW (ref 0.6–1.3)
GFR est AA: >60 mL/min/{1.73_m2} (ref >60)
GFR est non-AA: >60 mL/min/{1.73_m2} (ref >60)
Globulin: 4.7 g/dL — ABNORMAL HIGH (ref 2.0–4.0)
Glucose: 323 mg/dL — ABNORMAL HIGH (ref 74–99)
Potassium: 3.7 mmol/L (ref 3.5–5.5)
Protein, total: 8.3 g/dL — ABNORMAL HIGH (ref 6.4–8.2)
Sodium: 133 mmol/L — ABNORMAL LOW (ref 136–145)

## 2013-03-18 LAB — POC URINE MACROSCOPIC
Bilirubin: NEGATIVE
Glucose: 500 mg/dl — AB
Ketone: 80 mg/dl — AB
Leukocyte Esterase: NEGATIVE
Nitrites: NEGATIVE
Protein: NEGATIVE mg/dl
Specific gravity: >=1.03 (ref 1.005–1.030)
Urobilinogen: 0.2 EU/dl (ref 0.0–1.0)
pH (UA): 5 (ref 5–9)

## 2013-03-18 LAB — POC CHEM8
Anion gap: 18 mmol/L (ref 10–20)
BUN: 6 mg/dl — ABNORMAL LOW (ref 7–25)
CALCIUM,IONIZED: 4.3 mg/dl — ABNORMAL LOW (ref 4.40–5.40)
CO2, TOTAL: 24 mmol/L (ref 21–32)
Chloride: 98 mEq/L (ref 98–107)
Creatinine: 0.6 mg/dl (ref 0.6–1.3)
Glucose: 309 mg/dl — ABNORMAL HIGH (ref 74–106)
HCT: 43 % (ref 38–45)
HGB: 14.6 gm/dl (ref 13.0–17.2)
Potassium: 4.2 mEq/L (ref 3.5–4.9)
Sodium: 135 mEq/L — ABNORMAL LOW (ref 136–145)

## 2013-03-18 LAB — ACETONE/KETONE, QL

## 2013-03-18 LAB — POC HCG,URINE: HCG urine, QL: NEGATIVE

## 2013-03-18 LAB — GLUCOSE, POC
Glucose (POC): 332 mg/dL — ABNORMAL HIGH (ref 70–110)
Glucose (POC): 235 mg/dL — ABNORMAL HIGH (ref 65–105)

## 2013-03-18 NOTE — ED Provider Notes (Addendum)
Patient is a 27 y.o. female presenting with vaginal itching, rectal pain, and hyperglycemia.   Vaginal Itching     Anal Pain    High Blood Sugar         Past Medical History   Diagnosis Date   ??? Diabetes         History reviewed. No pertinent past surgical history.      History reviewed. No pertinent family history.     History     Social History   ??? Marital Status: SINGLE     Spouse Name: N/A     Number of Children: N/A   ??? Years of Education: N/A     Occupational History   ??? Not on file.     Social History Main Topics   ??? Smoking status: Current Every Day Smoker -- 0.25 packs/day for 4 years   ??? Smokeless tobacco: Not on file   ??? Alcohol Use: No   ??? Drug Use: No   ??? Sexually Active: Not on file     Other Topics Concern   ??? Not on file     Social History Narrative   ??? No narrative on file                  ALLERGIES: Review of patient's allergies indicates no known allergies.      Review of Systems   Gastrointestinal: Positive for rectal pain.       Filed Vitals:    03/18/13 0503   BP: 118/76   Pulse: 82   Temp: 97.7 ??F (36.5 ??C)   Resp: 17   Height: 5\' 3"  (1.6 m)   Weight: 135.172 kg (298 lb)   SpO2: 98%            Physical Exam     MDM     Differential Diagnosis; Clinical Impression; Plan:     Pt left without being seen          Procedures

## 2013-03-18 NOTE — ED Notes (Signed)
Patient unwilling to  Stay and continue treatment,patient says " take out my IV I am leaving" patient informed provider will be in her room ASAP,provider informed.

## 2013-03-18 NOTE — ED Notes (Signed)
Patient ambulatory from triage, alert oriented x 4, says " my BS was high and my groin area hurts",rates pain to groin area as 10/10. Patient not able to provide urine sample at this time, says " I don't feel like ",provider informed.

## 2013-03-18 NOTE — ED Notes (Signed)
Patient decides to LWBS,provider informed.

## 2013-03-18 NOTE — ED Provider Notes (Signed)
[facility]  EMERGENCY DEPARTMENT TREATMENT REPORT  NAME:  [name] [name]  SEX:   F  ADMIT: 03/18/2013  DOB:   [birth year]  MR#    [id number]  ROOM:    TIME DICTATED: 10 26 AM  ACCT#  [id number]    cc: [facility]     CHIEF COMPLAINT:  High blood sugar.    HISTORY OF PRESENT ILLNESS:  This is 27 year old female states her blood sugar has been running high for   the last 3 days, ranging between 400 and 600 and was 600 today.  The patient   has been taking her medications, her Glucophage and glipizide, and it has not   helped.  She has been urinating a lot and thirsty all the time, even after   drinking so much fluids.  She also presents complaining of a rash in her groin   for the last 2 to 3 days which is a painful burning.  She has been using some   diaper cream as well as an over-the-counter numbing cream and that has not   helped.  States she has had some blood per her rectum and some blood noted in   the stool today.  No constipation, no complaints of any abdominal pain, no   vomiting, but admits to nausea.    REVIEW OF SYSTEMS:  CONSTITUTIONAL:  No history of fever.  ENT:  No sore throat, no congestion.  RESPIRATORY:  No cough, no difficulty breathing.  CARDIOVASCULAR:  No chest pain.  GASTROINTESTINAL:  No abdominal pain.  Positive blood per rectum.  Positive   nausea.  ENDOCRINE:  Polydipsia, polyuria.  GENITOURINARY:  Positive for burning with urination.  INTEGUMENTARY:  Positive rash.  MUSCULOSKELETAL:  No extremity swelling.  NEUROLOGICAL:  No dizziness, no syncope.    PAST MEDICAL HISTORY:  The patient has a history of diabetes.  She has been on her meds for the last   year.  She takes Glucophage 1 gram b.i.d. as well as glipizide 5 mg b.i.d.    She had been doing okay up until the last 3 days when it has been running   high.    FAMILY HISTORY:  Positive for diabetes.    SOCIAL HISTORY:  Positive tobacco.      The patient also on Neurontin she states for joint pain.     PHYSICAL EXAMINATION:  VITAL SIGNS:  Blood 109/53, pulse 84, respirations 18, temperature 97.3.  GENERAL:  Well-developed, well-nourished female, alert.  HEENT:  Eyes:  Conjunctivae clear, lids normal.  Pupils equal, symmetrical,   and normally reactive.  Mouth/Throat:  Surfaces of the pharynx, palate, and   tongue are pink, moist, and without lesions.   NECK:  Supple, nontender, symmetrical, no masses or JVD, trachea midline,   thyroid not enlarged, nodular, or tender.  No cervical or submandibular   lymphadenopathy palpated.   HEART:  Normal sinus rhythm, no murmurs heard.  LUNGS:  Clear, no wheezing, no retractions, no respiratory distress.  ABDOMEN:  Soft, no tenderness on exam.  No guarding, no rebound, no mass   palpable.  RECTAL:  Small amount of brown stool.  Negative guaiac.  No mass palpable.    Some pain with rectal but no bright red blood seen.  GENITALIA:  The patient has erythema of the labia.  No edema.  No abscesses   noted.  Some whitish discoloration on the labia as well.    ASSESSMENT AND MANAGEMENT PLAN:  Hyperglycemia, known diabetic.  We will check i-STAT here, evaluate for any   ketoacidosis and hydrate the patient.    DIAGNOSTIC STUDIES:   Pregnancy test was obtained, it was negative.  Urine dip negative nitrites,   negative leukocytes, positive for glucose.  I-STAT obtained.  Blood sugar was   309, bicarbonate was 24, chloride of 98, sodium 135.  Venous pH obtained:  pH   7.37, pCO2 of 38.5, pO2 of 71.  Acetone  small.    COURSE IN THE EMERGENCY DEPARTMENT:   The patient was given fluid bolus, 2 liters of IV fluids, was given Diflucan   p.o. for the vaginitis.  Repeat Accu-Chek blood sugar of 235.  Insulin 4 units   subcutaneous given.    DIAGNOSES:  1.  Hyperglycemia.  2.  Vaginitis, candidiasis.    DISPOSITION AND PLAN:  The patient discharged.  Told to increase her morning glipizide 10 mg,   continue the 5 mg in the evening, continued the Glucophage, monitor her blood    sugars and she is to [facility] with [facility].  Also to follow   up with GI should the rectal bleeding continue.  Return for further concerns.    The patient evaluated by myself and Dr. Wilfrid Lund who agrees with above   assessment and plan.      ___________________  Erlinda Hong MD  Dictated By: Windell Hummingbird. Houle, PA    My signature above authenticates this document and my orders, the final  diagnosis (es), discharge prescription (s), and instructions in the [facility]   Pulsecheck record.  Nursing notes have been reviewed by the physician/mid-level provider.    If you have any questions please contact [phone].    JMB  D:03/18/2013 10:26:45  T: 03/18/2013 10:45:40  [id number]  Authenticated by Gwyneth Revels. Wilfrid Lund, M.D. On 03/18/2013 10:52:24 AM

## 2013-03-18 NOTE — ED Notes (Signed)
Patient states that her accu check 1 hr ago was 653mg /dl.  States that her vaginal and rectal area have been itching and raw for the past 3 days.

## 2013-04-22 LAB — POC HCG,URINE: HCG urine, QL: NEGATIVE

## 2013-04-22 LAB — POC URINE MACROSCOPIC
Bilirubin: NEGATIVE
Glucose: 500 mg/dl — AB
Ketone: 40 mg/dl — AB
Leukocyte Esterase: NEGATIVE
Nitrites: NEGATIVE
Protein: NEGATIVE mg/dl
Specific gravity: <=1.005 (ref 1.005–1.030)
Urobilinogen: 0.2 EU/dl (ref 0.0–1.0)
pH (UA): 5 (ref 5–9)

## 2013-04-22 NOTE — ED Provider Notes (Signed)
[facility]  EMERGENCY DEPARTMENT TREATMENT REPORT  NAME:  [name] [name]  SEX:   F  ADMIT: 04/22/2013  DOB:   [birth year]  MR#    [id number]  ROOM:    TIME DICTATED: 04 51 PM  ACCT#  [id number]    cc: [facility]     TIME OF EVALUATION:  1518    CHIEF COMPLAINT:  Bleeding hemorrhoids.    HISTORY OF PRESENT ILLNESS:  The patient is a 28 year old female coming in for assessment because she   thinks she may have some hemorrhoids.  She states for the past week or so   every time she had a bowel movement, she has seen some bright red blood on the   toilet paper and in the toilet.  She does not have bleeding in between bowel   movements or active gross hemorrhaging into her undergarments between bowel   movements.  She is denying any abdominal pain.  She has had a little bit of a   low backache for a week and intermittent dysuria and is concerned of   possibility of urinary tract infection.  She is denying any vomiting,   diarrhea, fevers or chills.    REVIEW OF SYSTEMS:  CONSTITUTIONAL:  No fever, chills, or weight loss.   GASTROINTESTINAL:  No vomiting, diarrhea, or abdominal pain.  Positive bright   red blood per rectum.   GENITOURINARY:  Intermittent dysuria and some low backache.  MUSCULOSKELETAL:  Low back pain.  INTEGUMENTARY:  No rashes.   NEUROLOGICAL:  No headaches.    PAST MEDICAL HISTORY:  Diabetes.    FAMILY HISTORY:  Noncontributory.    SOCIAL HISTORY:  She is a tobacco smoker, social drinker, denies drug use.    ALLERGIES:  REVIEWED IN IBEX.    MEDICATIONS:  Reviewed in Ibex.    PHYSICAL EXAMINATION:  VITAL SIGNS:  Blood pressure 128/70, pulse 94, respirations 18, temperature   98.1, saturations 97% on room air.  GENERAL APPEARANCE:  Patient appears well developed and well nourished.    Appearance and behavior are age and situation appropriate.   RESPIRATORY:  Clear and equal breath sounds.  No respiratory distress,   tachypnea, or accessory muscle use.      CARDIOVASCULAR:   Heart regular, without murmurs, gallops, rubs, or thrills.       GASTROINTESTINAL:  Abdomen, soft, extremely nontender to palpation.  No   guarding or rebound whatsoever.  RECTAL:  Exam performed.  She had 2 visible external hemorrhoids, not actively   thrombosed, and not actively bleeding on exam.  Sphincter tone is normal.    She has some small palpable internal hemorrhoids as well.  Has some light   brown stool to a gloved finger and it is guaiac-negative.  There is no gross   or active bleeding of the hemorrhoids appreciated on exam.    MUSCULOSKELETAL:  She is moving her extremities appropriately.  SPINE:  There is no localized cervical, thoracic, lumbar or sacral body   tenderness to palpation or fist percussion.  There are no bony step-offs,   ecchymosis, areas of soft tissue swelling or deformities.   SKIN:  Warm and dry, no appreciable rashes.    INITIAL ASSESSMENT AND MANAGEMENT PLAN:  This is a 28 year old female coming in for assessment of bright red blood per   rectum, likely from her hemorrhoids; when she has a bowel movement and passes   stool, she likely has some bleeding from those areas.  No  abdominal pain on   exam and no history of such.  She is concerned about a possible UTI.  We will   check a urine and a urine pregnancy and have her follow up with GI.      DIAGNOSTIC INTERPRETATIONS:  Urinalysis showed 500 glucose, 40 ketones, large blood, otherwise   unremarkable.  Urine pregnancy negative.  The patient notified of her testing   results.    DIAGNOSES:  1.  Hemorrhoids.  2.  Low back pain.    DISPOSITION:  She is discharged in stable condition with followup information for Dr.   Marilynn RailHowerton, gastroenterologist, and to [facility].  Return with any new or worsening concerns.  Given   prescriptions for Naprosyn, Robaxin and Colace.  The patient was personally   evaluated by myself and Dr. Delton SeeNelson, who agrees with the above assessment  and   plan.      ___________________  Gwenyth Allegraimothy S Ladavion Savitz MD  Dictated By: Bettey MareShannon R. Earlene PlaterWallace, PA-C    My signature above authenticates this document and my orders, the final  diagnosis (es), discharge prescription (s), and instructions in the [facility]   Pulsecheck record.  Nursing notes have been reviewed by the physician/mid-level provider.    If you have any questions please contact [phone].    DE  D:04/22/2013 16:51:32  T: 04/22/2013 09:81:1920:38:45  [id number]  Authenticated by Gwenyth Allegraimothy S. Jaliya Siegmann, M.D. On 04/26/2013 01:51:00 PM

## 2013-05-15 NOTE — ED Provider Notes (Signed)
[facility]  EMERGENCY DEPARTMENT TREATMENT REPORT  NAME:  [name] [name]  SEX:   F  ADMIT: 05/15/2013  DOB:   [birth year]  MR#    [id number]  ROOM:    TIME DICTATED: 06 53 PM  ACCT#  [id number]    cc: [facility]     CHIEF COMPLAINT:  Vaginal itching and burning.    HISTORY OF PRESENT ILLNESS:  The patient is a 28 year old female who has had irritation of the vulvovaginal   area.  She has been using A&D ointment, Desitin cream.  She has been using   over-the-counter athlete's foot ointment.  She complains of burning with   urination and raw skin in the perineum.  She is a diabetic and tells me that   her blood sugars have been running 400- 500 over the last few days.  She tells   me that she was tested at the clinic in December for STDs and these were   negative.  The patient is involved in a monogamous lesbian relationship at   this time. She denies fevers.    REVIEW OF SYSTEMS:  CONSTITUTIONAL:  No fever, chills, or weight loss.   EYES:   No visual symptoms.   ENT:  No sore throat, runny nose, or other URI symptoms.   ENDOCRINE:  Positive for elevated blood sugars in the 400 to 500 range.  HEMATOLOGIC/LYMPHATIC:   No excessive bruising or lymph node swelling.   RESPIRATORY:  No cough, shortness of breath, or wheezing.   CARDIOVASCULAR:  No chest pain, chest pressure, or palpitations.   GASTROINTESTINAL:  No vomiting, diarrhea, or abdominal pain.   GU:  Positive for discomfort in the vulvovaginal area.  Positive for dysuria   as well as some frequency.  Positive for vaginal discharge of white material.    Positive for itching and burning.  MUSCULOSKELETAL:  No joint pain or swelling.  INTEGUMENTARY:  No rashes.  NEUROLOGICAL:  No headaches, sensory or motor symptoms.  PSYCHIATRIC:  No suicidal or homicidal ideation.    PAST MEDICAL HISTORY:  Type 2 diabetes.    PAST SURGICAL HISTORY:  None.    SOCIAL HISTORY:  The patient does not smoke, does not use alcohol, denies using recreational    drugs.  She has exclusive lesbian relationship with her partner, who is in the   room with her.    FAMILY HISTORY:  Noncontributory.    ALLERGIES:  NO KNOWN DRUG ALLERGIES.    MEDICATIONS:  Glipizide, metformin and Lantus.    PHYSICAL EXAMINATION:  VITAL SIGNS:  Temperature 97.4, blood pressure 120/74, pulse 81, respiratory   rate 20, pain 10 out of 10, pulse oximetry 100% on room air.  GENERAL APPEARANCE:  Patient appears well developed and well nourished.    Appearance and behavior are age and situation appropriate.   EYES:  Conjunctivae clear.  Lids normal.  Pupils symmetric.    ENT:  Hearing is grossly intact to voice.  External exam ears and nose   unremarkable.  Mouth/Throat:  Surfaces of the pharynx, palate, and tongue are   pink, moist, and without lesions.   NECK:  Supple, nontender.  LYMPHATICS:  No cervical or submandibular lymphadenopathy palpated.    RESPIRATORY:  Clear and equal breath sounds.  No respiratory distress,   tachypnea, or accessory muscle use.     CARDIOVASCULAR:  Regular rate and rhythm.  CHEST:  Chest symmetrical without masses or tenderness.  GASTROINTESTINAL:  Abdomen soft, nontender, nondistended, normal  bowel sounds.  GENITOURINARY:  The patient has some raw excoriated skin to tiny patches in   the groin creases lateral to the labia bilaterally.  When I gently spread the   labia apart, there is tenderness to palpation in this area as well as some   chunky white discharge.  Patient has a normal appearing cervix.  I do not   really appreciate any adnexal tenderness, but the patient is just   uncomfortable from the excoriated skin in the perineum.  MUSCULOSKELETAL:  No cva tenderness.  SKIN:  Perineal excoriation and vulvovaginal excoriation.  No other rashes.  NEUROLOGIC:  The patient is awake, alert.  She moves all extremities   symmetrically.  Sensation is intact to light touch.  PSYCHIATRIC:  Oriented to time, place and person.  Mood and affect    appropriate.      EMERGENCY DEPARTMENT COURSE/MEDICAL DECISION MAKING:  Clinically, this appears to be a yeast infection.  The patient's blood sugars   have been poorly controlled.  Indeed today, her Accu-Chek was 395.  She is not   vomiting, I do not think that we need to do lab work for DKA.  On my pelvic   exam, clinically we are going to treat this as yeast.  The wet prep bacterial   vaginosis with clue cells, did not see any yeast.  Again clinically, I think   it is yeast.  Her urinalysis was negative for leukocytes and nitrites,   negative for blood so I do not think this is a urinary tract infection.  She   is spilling glucose in her urine with 500 mg/dL of glucose.  Urine pregnancy   screen was negative.  Medicated today with Diflucan  200 mg.  I have written a   prescription for another 150 mg tablet to be given in 3 days.  We are going   to treat the bacterial vaginosis with Flagyl 500 mg b.i.d. times 7 days and I   have given her a single Norco 5/325 here as well as prescription for 8 more   tablets.    DIAGNOSES:  1.  Vulvovaginitis (candidal).  2.  Bacterial vaginosis.  3.  Hyperglycemia.     PLAN:  The patient needs to follow up with primary care provider to get blood sugars   under better control.  We will treat the yeast infection for now, but I   suspect this will be recurrent if we are not able to get these sugars under   better control.  Also treat with metronidazole for bacterial vaginosis.      ___________________  Gwenyth Allegraimothy S Deep Bonawitz MD  Dictated By: Marland Kitchen.     My signature above authenticates this document and my orders, the final  diagnosis (es), discharge prescription (s), and instructions in the [facility]   Pulsecheck record.  Nursing notes have been reviewed by the physician/mid-level provider.    If you have any questions please contact [phone].    AK  D:05/15/2013 18:53:47  T: 05/15/2013 21:59:06  [id number]  Authenticated by Gwenyth Allegraimothy S. Ruford Dudzinski, M.D. On 06/05/2013 09:35:50 AM

## 2013-05-19 NOTE — ED Provider Notes (Signed)
[facility]  EMERGENCY DEPARTMENT TREATMENT REPORT  NAME:  [name] [name]  SEX:   F  ADMIT: 05/19/2013  DOB:   [birth year]  MR#    [id number]  ROOM:    TIME DICTATED: 06 56 AM  ACCT#  [id number]    cc: [facility]     TIME OF EVALUATION:  [phone].    PRIMARY CARE PHYSICIAN:  The patient attends the [facility].    CHIEF COMPLAINT:  Vaginal burning and itching.      HISTORY OF PRESENT ILLNESS:   This is a 28 year old female with poorly controlled diabetes who was seen here   02/03 for similar symptomatology, diagnosed with candida vulvovaginitis at   that time.  Given Flagyl and Diflucan in the Emergency Department and then an   additional dose of Diflucan which she took yesterday.  She still has 3 days   left of the Flagyl to take.  She comes in complaining still of vaginal burning   and itching.  She was also given Norco at home for that.  She states that she   has been taking that, but it is not helping with the irritation, which is her   primary complaint today.  It burns when she tries to keep herself clean and   after she goes to the bathroom.  The pain again is a burning type of pain.    She rates it as a 6 or 7 out of 10.  It hurts worse when she is trying to wipe   after going to the bathroom and she does have some burning with the urine   when she goes to the bathroom.  Nothing she has been doing has been making it   better, getting worse.  She has been trying to use some Desitin over the   counter, some various other creams and ointments, but not having any relief.    She tells me that she is still having uncontrolled blood sugar; as she checked   it this morning and it was in excess of 600 and she takes metformin and   Lantus for that.  She is to have a followup appointment at the clinic next   week.  She otherwise denies any fevers, cough, chest pain, shortness of breath   or abdominal pain.  No nausea, no vomiting, no diarrhea.    REVIEW OF SYSTEMS:    CONSTITUTIONAL:  No fever, chills or weight loss.   RESPIRATORY:  No shortness of breath, cough or wheezing.   CARDIOVASCULAR:  No chest pain, pressure or palpitations.   ENDOCRINE:  Positive for diabetic symptomatology.    GENITOURINARY:  Positive for burning.  NEUROLOGICAL:  No headaches, sensory or motor symptoms.     PAST MEDICAL HISTORY:  Diabetes.    PAST SURGICAL HISTORY:  None.    FAMILY HISTORY:   Noncontributory to this case.      PSYCHIATRIC HISTORY:  None.    SOCIAL HISTORY:   The patient smokes cigarettes, drinks occasionally, does not use illicit   drugs.    ALLERGIES:   NO KNOWN DRUG ALLERGIES.    MEDICATIONS:  Reviewed in Ibex.    PHYSICAL EXAMINATION:  VITAL SIGNS:  Blood pressure 148/72, pulse 81, respirations 18, temperature   98, satting at 99% on room air.    CONSTITUTIONAL:  This is a morbidly obese 28 year old female lying comfortably   on the bed.  She does not appear to be in acute distress, cooperative  with my   evaluation.    RESPIRATORY:  Clear and equal breath sounds.  No respiratory distress,   tachypnea or accessory muscle use.   CARDIOVASCULAR:   Heart regular, without murmurs, gallops, rubs or thrills.   GENITOURINARY:  Female genitalia.  External genitalia without swelling or   lesions.  She does have some curd-like discharge in the external vulva.  She   also has some erythema surrounding her vagina.  Vaginal walls also have a   curd-like substance appearing to them.  There are otherwise no lesions.  No   pain on cervical motion.  No adnexal mass or tenderness noted.    NEUROLOGIC:  The patient is alert and oriented times 3.  She is able to move   all extremities, upper and lower bilaterally without difficulty.      CONTINUATION BY JEFFREY PADGETT, PA-C:    IMPRESSION AND PLAN:  This is a 28 year old female previously diagnosed with vulva candidiasis who   presents for evaluation of burning and continued pain from her previous visit.     She still has to take 3 days of Flagyl.  She just took her last Diflucan   yesterday.  Clinically, her evaluation is consistent with a yeast infection.    It seems like she is more here today because of the pain associated the   irritation that she had previously.  Her blood sugars continue to be in   control, but she was in excess of 600 this morning and 400 here.  The plan   will be to give her a sliding scale Humalog and discharge her with some   additional pain medication.  Advised to follow up to get her blood sugar under   control or this is going to keep happening.      EMERGENCY DEPARTMENT COURSE:   The patient was given a dose of Humalog.      DIAGNOSES:  1. Vulvovaginitis candidiasis.  2. Vaginal pain.    DISPOSITION:  The patient remained completely stable during her course in the Emergency   Department.  She was discharged home in stable condition.  I gave her 8 pills   of Percocet and again strongly recommended that she follow up with respect to   her uncontrolled blood sugar and the fact that this is just going to continue   to happen if she does not get it under control.  Also advised her to take   ibuprofen over the counter for pain and wrote her a work note.  The patient   was agreeable to the plan.  The patient was seen and evaluated by myself and   Dr. Wilfrid LundVanden Hoek who agrees with the above assessment and plan.      ___________________  Erlinda Hongodd Vanden Hoek MD  Dictated By: Fransisca KaufmannJeffrey Padgett, PA-C    My signature above authenticates this document and my orders, the final  diagnosis (es), discharge prescription (s), and instructions in the [facility]   Pulsecheck record.  Nursing notes have been reviewed by the physician/mid-level provider.    If you have any questions please contact [phone].    SC  D:05/19/2013 06:56:20  T: 05/19/2013 13:55:34  [id number]  Authenticated by Gwyneth Revelsodd L. Wilfrid LundVanden Hoek, M.D. On 05/22/2013 03:22:53 PM

## 2013-07-28 NOTE — H&P (Signed)
[facility]  History and Physical  NAME:  [name] [name], [name]  SEX:   F  ADMIT: 07/28/2013  DOB:Jan 16, 1986  MR#    [id number]  ROOM:  CD10  ACCT#  [id number]    I hereby certify this patient for admission based upon medical necessity as  noted below:    <    DATE OF ADMISSION:   07/28/2013    CHIEF COMPLAINT:  Not feeling well.    HISTORY OF PRESENT ILLNESS:  The patient is a 28 year old female with history of uncontrolled diabetes who  came through the emergency room with a chief complaint not feeling well.  According to patient, she has been feeling weak.  She has been nauseated, had  some numbness in the right arm.  She checked her blood glucose, which was over  700.  She took her insulin.  She took her diabetes medicines but was not  getting better.  Finally, she decided to come to the emergency room.    REVIEW OF SYSTEMS:  Negative for all other systems except above-mentioned chief complaint.  A 12  plus review of systems was done.    PAST MEDICAL HISTORY:  Significant for diabetes type 2.    PAST SURGICAL HISTORY:  Negative.    FAMILY HISTORY:  Positive for diabetes in mother and grandparents.    PERSONAL SOCIAL HISTORY:  The patient is single.  Has quit smoking a few months ago.  Denies any alcohol  use.  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    CURRENT MEDICATIONS:  The patient was taking Lantus insulin 45 units twice a day, metformin 1 gram  twice a day, glipizide 10 mg half a tablet twice a day.    ALLERGIES:  THE PATIENT IS NOT ALLERGIC TO ANY MEDICATION.    PHYSICAL EXAMINATION:  GENERAL:  Showed a young female, morbidly obese, who was in no acute distress.  VITAL SIGNS:  Blood pressure 116/49, pulse 79, respirations 20, temperature  97.7.  SKIN:  Warm and dry.  HEENT:  Normocephalic, atraumatic.  NECK:  Supple, no neck vein distention, no carotid bruit, no thyromegaly.  CHEST:  Symmetrical, normal expansion.  CARDIOVASCULAR:  Normal S1 and S2, regular rhythm.  ABDOMEN:  Soft, obese, nontender.   EXTREMITIES:  Showed no edema.  NEUROLOGIC:  The patient is awake, alert.  Strength was 5/5 both upper and  lower extremities.    PERTINENT LABORATORIES:  Chemistry shows sodium 135, potassium 3.9, chloride 95, bicarbonate 22, BUN 8,  creatinine 0.5, glucose 499.  Venous blood gas showed pH 7.3.  Pregnancy test  was negative.  Urinalysis showed positive glucose.    ASSESSMENTS:  1.  Severe hyperglycemia.  2.  Diabetes mellitus type 2, uncontrolled with diabetic neuropathy.  3.  Morbid obesity.    MANAGEMENT PLAN:  The patient being admitted to regular floor.  The patient will be started on  IV fluids.  The patient's Lantus insulin be increased to 50 units twice a day,  Accu-Cheks will be done before meals and bedtime and cover with sliding scale  regular insulin.  We will check serial cardiac markers.  We will repeat  chemistry, lipids, hemoglobin A1c and B12 level.  The patient was strongly  advised to watch her diet.  If the patient remained stable, blood glucose gets  under control, she can be discharged home in the next 24 to 48 hours.      ___________________  Hazel Samsahir Anamae Rochelle MD  Dictated By: .  LB  D:07/28/2013 12:44:47  T: 07/28/2013 16:22:32  [id number]  Electronically Authenticated by:  Hazel SamsAHIR Kendrik Mcshan, MD On 07/29/2013 04:21 PM EDT

## 2013-07-28 NOTE — ED Provider Notes (Signed)
[facility]  EMERGENCY DEPARTMENT TREATMENT REPORT  NAME:  [name] [name] [name], [name] [name]  SEX:   F  ADMIT: 07/28/2013  DOB:   Aug 17, 1985  MR#    [id number]  ROOM:  CD10  TIME DICTATED: 11 45 AM  ACCT#  [id number]    [facility]     I HEREBY CERTIFY THIS PATIENT FOR ADMISSION BASED UPON MEDICAL NECESSITY AS  NOTED BELOW:     TIME OF SERVICE:   8:26 a.m.     CHIEF COMPLAINT:  Hyperglycemia 700.    HISTORY OF PRESENT:  The patient is a [age] year old diabetic coming in for evaluation of  hyperglycemia.  She awoke this morning with a fasting glucose of 680, says she  felt kind of weak all over, has had a little bit of blurry vision this morning  and been nauseous and noted some urinary frequency.  She felt fine yesterday  and before going to bed last night.  Her fasting yesterday morning was in the  200s and before bed last night her glucose was 187.  She usually only takes it  twice a day.  She takes Lantus, metformin and glipizide all b.i.d. at standard  dosing, does not use any sliding scale insulin routinely.  She denies any  headache, abdominal pain, vomiting, diarrhea, dysuria, urgency, recent fevers  or URI symptoms.  She was at work this morning attempting to get through a  work day when she finally decided to come in for assessment.    REVIEW OF SYSTEMS:  CONSTITUTIONAL:  No fevers.  EYES:  Some blurry vision bilaterally.  No other visual deficits.    ENT:  No sore throat, runny nose, or other URI symptoms.  ENDOCRINE:  Positive polyuria and polydipsia.  RESPIRATORY:  No cough or shortness breath.  CARDIOVASCULAR:  No chest pain.  GASTROINTESTINAL:  No abdominal pain, vomiting or diarrhea.    GENITOURINARY:  No dysuria or urgency.  Positive frequency.  MUSCULOSKELETAL:  Generalized weakness.   INTEGUMENTARY:  No rashes.  NEUROLOGIC:  No headache.    PAST MEDICAL HISTORY:  Diabetes.    FAMILY HISTORY:  Diabetes.    SOCIAL HISTORY:   She smokes.  Denies drug use.  Social drinker.  Lives at home alone.    ALLERGIES:   REVIEWED IN IBEX.    MEDICATIONS:   Reviewed in Ibex.      PHYSICAL EXAMINATION:  VITAL SIGNS:  Blood pressure 130/67, pulse 74, respirations 16, temperature  97.7, sats 97% on room air.  GENERAL APPEARANCE:  Patient appears well developed and well nourished.  Appearance and behavior are age and situation appropriate.  The patient is  morbidly obese, appears like she does not feel well, but not acutely   distressed.  HEENT:  Oral mucosa is notably dry, otherwise unremarkable.  NECK:  Supple, symmetrical.  Trachea is midline.    RESPIRATORY:  Clear and equal breath sounds.  No respiratory distress,  tachypnea, or accessory muscle use.    CARDIOVASCULAR:   Heart regular, without murmurs, gallops, rubs, or thrills.    GASTROINTESTINAL:  Abdomen is soft, completely nontender to palpation.  MUSCULOSKELETAL:  She can move all 4 extremities with equal and symmetric  strength.  SKIN:  Warm and dry.  NEUROLOGICAL:  She is alert and oriented times 3.    PSYCHIATRIC:  Oriented to time, place and person.  Mood and affect   appropriate.     INITIAL ASSESSMENT AND MANAGEMENT PLAN:  This is a 28 year old  female coming in for evaluation of hyperglycemia, a  little generalized weakness and some blurry vision.  We will start with an  i-STAT and give her some fluids.  She is in stable condition at this time.    CONTINUATION BY Orma Flamingobert Arliss Hepburn, MD    INITIAL ASSESSMENT AND MANAGEMENT PLAN:   This is a new problem for this patient.     DIAGNOSTIC STUDIES:   The following were obtained:  The i-STAT shows a glucose of 499, chloride 95,  sodium 135, potassium 3.9, CO2 of 22.   Urinalysis shows glucose greater than  500 and 40 ketones.  Pregnancy test is negative.  Recheck i-STAT shows sodium  137, potassium 3.8, chloride 96, CO2 24, and glucose of 392.    EMERGENCY DEPARTMENT COURSE:   The patient was stable while in the Emergency Department.  She received a  bolus of IV normal saline and then was given a subcutaneous dose of 8 units of  insulin.  We rechecked her a couple of hours later and her anion gap had  actually increased from 18 to 19, although her sugar had come down a little  bit.  For this reason, we started her on an insulin drip.  We have spoken to  Dr. Tasia CatchingsAhmed, who will admit the patient for further evaluation.    CLINICAL IMPRESSION AND DIAGNOSES:   1.  Hyperglycemia.  2.  Acidosis.     DISPOSITION AND PLAN:   Admitted to a regular floor bed, under observation status under Dr. Tasia CatchingsAhmed.      ___________________  Posey Prontoobert E Deshia Vanderhoof MD  Dictated By: Bettey MareShannon R. Earlene PlaterWallace, PA-C    My signature above authenticates this document and my orders, the final  diagnosis (es), discharge prescription (s), and instructions in the [facility]  Pulsecheck record.  Nursing notes have been reviewed by the physician/mid-level provider.    If you have any questions please contact [phone].    DE  D:07/28/2013 11:45:05  T: 07/28/2013 12:14:54  [id number]  Electronically Authenticated by:  Posey Prontoobert E. Rindi Beechy, M.D. On 08/03/2013 09:42 AM EDT

## 2013-07-29 NOTE — Discharge Summary (Signed)
[facility]  Discharge Summary   NAME:  [name] [name], [name]  SEX:   F  ADMIT: 07/28/2013  DISCH:   DOB:   12-28-1985  MR#    [id number]  ACCT#  [id number]    cc: . Marland Kitchen.     DATE OF ADMISSION:  07/28/2013     DATE OF DISCHARGE:  07/29/2013    FINAL DIAGNOSES:  1.  Severe hyperglycemia.  2.  Diabetes mellitus type 2, uncontrolled, with diabetic neuropathy.  3.  Morbid obesity with a body mass index of 49.8.  4.  Hypomagnesemia.    HOSPITAL COURSE:  The patient is a 28 year old female with a history of uncontrolled diabetes  who came to the Emergency Room with a chief complaint of not feeling well.  Her blood sugar has been extremely high.  The patient was admitted to regular  floor on telemetry for observation.  The patient was started on IV fluids.  The patient was initially started on insulin drip which was later discontinued  and Accu-Cheks were done every 2 hours.  The next day, the patient was feeling  better.  She was tolerating a diabetic diet.  She denied any chest pain,  shortness of breath.  Numbness in the right arm improved.    PHYSICAL EXAMINATION ON THE DISCHARGE:  GENERAL:  Showed young female, morbidly obese, who was in no acute distress.  VITAL SIGNS:  Stable.  LUNGS:  Clear.  HEART:  Normal S1 and S2, regular rhythm.    PERTINENT LABS:  Hemoglobin A1c was 13.9.  Lipids: Total cholesterol 115, HDL 49, triglycerides  73, LDL of 51.  Magnesium was 1.7, which was replaced.  Vitamin D was 24.2.  Cardiac markers were normal.  Thyroid function was normal.  B12 was normal.    DISCHARGE INSTRUCTIONS:   The patient is being discharged home with [facility] 3 to 5 days.    CONDITION ON DISCHARGE:  Stable.    ACTIVITY:  As tolerated.    DIET:  Diabetic diet.  The patient was strongly advised to lose weight.    DISCHARGE MEDICATIONS:  The patient will continue metformin 1 gram twice a day, Glipizide 5 mg twice a   day.  The patient's Lantus insulin was increased to 60 units twice a day.  The  patient was advised to take calcium and vitamin D twice a day.  She was given  prescription for Humulin R on a sliding scale basis.    TIME SPENT:    Total time spent on the day of discharge was over 35 minutes.      ___________________  Hazel Samsahir Ronda Rajkumar MD  Dictated By: .   DE  D:07/29/2013 10:37:44  T: 07/29/2013 14:14:46  [id number]  Electronically Authenticated and Edited by:  Hazel SamsAHIR Sergio Hobart, MD On 07/29/2013 04:06 PM EDT

## 2014-01-08 NOTE — ED Provider Notes (Addendum)
[facility]  EMERGENCY DEPARTMENT TREATMENT REPORT  NAME:  [name] ProwsHOMPSON, Libbi  SEX:   F  ADMIT: 01/06/2014  DOB:   12-31-1985  MR#    [id number]  ROOM:    TIME DICTATED: 12 48 PM  ACCT#  [id number]    [facility]: [facility]     TIME OF EVALUATION:  1456.    PRIMARY CARE [facility]YSIC[facility]  [facility].    CHIEF COMPLAINT:    Laceration.    HISTORY OF PRESENT ILLNESS:  A 28 year old female approximately 40 minutes prior to arrival was at work and  accidentally cut the tip of her right 3rd finger in the food processer.  Bleeding is controlled.  Tetanus is up to date.  She is right hand dominant.  Pain is 10 out of 10, burning in nature.  No radiation.    REVIEW OF SYSTEMS:  MUSCULOSKELETAL:  No joint pain or swelling.  SKIN:  As above.  NEUROLOGIC:  No sensory or motor symptoms.    PAST MEDICAL HISTORY:  Diabetes.    SOCIAL HISTORY:  Tobacco use, consumes alcohol occasionally, denies recreational drug use.  Denies any chance of pregnancy.    ALLERGIES:  NONE.    MEDICATIONS:  Lantus, metformin.    PHYSICAL EXAMINATION:  VITAL SIGNS:  BP 122/86, pulse 80, respirations 18, temperature 98.2, pain 10,  O2 sat 99% on room air.  GENERAL APPEARANCE:  The patient appears well developed, well nourished.  SKIN/MUSCULOSKELETAL: Tip of the right 3rd finger has an avulsion type  laceration approximately 1 x 1 cm in diameter.  There is no active bleeding.  Fingertip otherwise pink, warm and dry.  There is no swelling, no underlying  bony tenderness and all joints have full active range of motion with good  resistive strength.  NEUROLOGIC:  Light sensation intact throughout the right 3rd finger including  the distal tip around the laceration.  Nail beds pink with prompt capillary  refill.    INITIAL ASSESSMENT AND MANAGEMENT PLAN:  A patient with avulsion and laceration to the tip of her right 3rd finger.  There is nothing to suture.  Wound will be cleaned and then will apply a layer   of Dermabond to help protect the wound and also prevent additional bleeding.  Wound will then be dressed and she will be discharged home.  On arrival, she  did request fingerstick blood sugar check.  She thought cutting her finger  would raise her blood sugar.  She is a diabetic on insulin but assures me she  is well controlled.  Blood sugar last night was 130.  I have reassured her  that cutting her finger should not affect her blood sugar.  She should have no  difficulty healing such a minor wound with well controlled sugars.  She was  very reassured, no longer felt the need to have her blood sugar checked and I  do not feel that this is necessary, given that she is a well controlled  diabetic.    PROCEDURE:  Performed by myself.  The patient soaked the finger in normal saline for  approximately for 10 minutes to clean the wound.  I then irrigated it with  normal saline.  There is no foreign body contamination.  Wound was dried.  Dermabond applied followed by gauze dressing.  The patient tolerated procedure  well.  Wound was measured to be 1.0 cm.     DIAGNOSIS:  A 1.0 cm right third digit laceration, simple repair.  DISPOSITION:  The patient discharged home in stable condition with verbal and written  instructions for ongoing care.  She is to follow wound care instructions that  are provided.  She needs to return to the Emergency Department should she  develop redness, swelling, pustular drainage from the site or fever.  The  patient was personally evaluated by myself and Dr. Buddy Duty, who agrees with the  above assessment and plan.      ___________________  Lucio Edward MD  Dictated By: Verlin Grills, PA    My signature above authenticates this document and my orders, the final  diagnosis (es), discharge prescription (s), and instructions in the [facility]  Pulsecheck record.  Nursing notes have been reviewed by the physician/mid-level provider.    If you have any questions please contact [phone].    AK   D:01/07/2014 12:48:17  T: 01/08/2014 00:07:42  [id number]  Electronically Authenticated by:  Lucio Edward, MD On 01/15/2014 05:24 PM EDT

## 2014-02-23 LAB — METABOLIC PANEL, BASIC
BUN: 13 mg/dl (ref 7–25)
CO2: 26 mEq/L (ref 21–32)
Calcium: 8.4 mg/dl — ABNORMAL LOW (ref 8.5–10.1)
Chloride: 107 mEq/L (ref 98–107)
Creatinine: 0.8 mg/dl (ref 0.6–1.3)
GFR est AA: >60
GFR est non-AA: >60
Glucose: 93 mg/dl (ref 74–106)
Potassium: 3.4 mEq/L — ABNORMAL LOW (ref 3.5–5.1)
Sodium: 141 mEq/L (ref 136–145)

## 2014-02-23 LAB — CBC WITH AUTOMATED DIFF
BASOPHILS: 0.3 % (ref 0–3)
EOSINOPHILS: 1.9 % (ref 0–5)
HCT: 38.6 % (ref 37.0–50.0)
HGB: 12.6 gm/dl — ABNORMAL LOW (ref 13.0–17.2)
IMMATURE GRANULOCYTES: 0.2 % (ref 0.0–3.0)
LYMPHOCYTES: 31.5 % (ref 28–48)
MCH: 29.4 pg (ref 25.4–34.6)
MCHC: 32.6 gm/dl (ref 30.0–36.0)
MCV: 90.2 fL (ref 80.0–98.0)
MONOCYTES: 5.9 % (ref 1–13)
MPV: 8.8 fL (ref 6.0–10.0)
NEUTROPHILS: 60.2 % (ref 34–64)
NRBC: 0 (ref 0–0)
PLATELET: 344 10*3/uL (ref 140–450)
RBC: 4.28 M/uL (ref 3.60–5.20)
RDW-SD: 43.2 (ref 36.4–46.3)
WBC: 10.2 10*3/uL (ref 4.0–11.0)

## 2014-02-23 LAB — TROPONIN I: Troponin-I: <0.015 ng/ml (ref 0.00–0.09)

## 2014-02-23 NOTE — ED Provider Notes (Addendum)
[facility]  EMERGENCY DEPARTMENT TREATMENT REPORT  NAME:  [name] [name]  SEX:   F  ADMIT: 02/23/2014  DOB:   Sep 01, 1985  MR#    [id number]  ROOM:    TIME DICTATED: 12 33 PM  ACCT#  [id number]    cc: [facility]     TIME OF EVALUATION:  1054    PRIMARY CARE PHYSICIAN:  None.    CHIEF COMPLAINT:  Chest pain.    HISTORY OF PRESENT ILLNESS:  A 28 year old female who works at [employer]. At 0830 this morning  while at work, she developed substernal chest pain a few moments after she  developed pain to the right upper neck that radiated paresthesias and pain  down her right upper extremity.  She is not sure if she just clenched her  chest because of the pain in her right shoulder, but has had chest pain  described as an improving pain, aching, and now no longer radiating pain.  She  presents for further evaluation to Emergency Department.    REVIEW OF SYSTEMS:  CONSTITUTIONAL:  No fevers or shivering chills.  EYES:   No visual symptoms.  ENT:  No sore throat, runny nose, or other URI symptoms.   RESPIRATORY:  No dyspnea, cough or wheezing.  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  No pressure or palpitations,  described as sharp, now a  dull ache.  GASTROINTESTINAL:  No vomiting, diarrhea, or abdominal pain.  No nausea.  MUSCULOSKELETAL:  Pain radiating down right upper extremity, now resolved,  lasting approximately 30 minutes.  No other joint pain or swelling.  INTEGUMENTARY:  No rashes.  NEUROLOGIC:  Paresthesias, right hand.  No headaches.  No other sensory or  motor symptoms.  Denies current sensory or motor symptoms.  Denies unilateral  weakness, slurred speech, facial droop or altered mental status.    PAST MEDICAL HISTORY:  Type 2 diabetes, insulin dependent.    SOCIAL HISTORY:  Former smoker, not a current smoker.  Social ETOH.  No drug use.    FAMILY HISTORY:   No immediate family history of CAD. No immediate family history of MI, cardiac  stenting or bypass.    ALLERGIES:  None.    CURRENT MEDICATIONS:  Lantus.    PHYSICAL EXAMINATION:  VITAL SIGNS:  Blood pressure 110/45, pulse 71, respirations 16, temperature  97.9, pain 8 out of 10, O2 sat is 94% recorded. While I am in the room that  the patient's oxygen never drops below 95%.  GENERAL APPEARANCE:  Patient appears well developed and well nourished.  Appearance and behavior are age and situation appropriate.   Eyes:  Conjunctivae clear, lids normal.  Pupils equal, symmetrical, and  normally reactive.   Ears/Nose:  Hearing is grossly intact to voice.  Internal and external  examinations of the ears and nose are unremarkable.   Mouth/Throat:  Surfaces of the pharynx, palate, and tongue are pink, moist,  and without lesions.   Nasal mucosa, septum, and turbinates unremarkable.   Teeth and gums unremarkable.   RESPIRATORY:  Clear and equal breath sounds.  No respiratory distress,  tachypnea, or accessory muscle use.   CARDIOVASCULAR:   Heart regular, without murmurs, gallops, rubs, or thrills.   CHEST:  Chest symmetrical with completely reproducible tenderness with  palpation of the chest wall which makes the patient grimace and withdrawal  from pain. Calves  soft, nontender.  No peripheral edema.  Dorsalis pedis and  radial pulses 2+ and equal.  GASTROINTESTINAL:  Abdomen soft, nontender.    MUSCULOSKELETAL/ SPINE:  There is no localized cervical, thoracic, lumbar or  sacral body tenderness to palpation or fist percussion.  There are no bony  step-offs, ecchymosis, areas of soft tissue swelling or deformities.  No  tenderness to palpation to right upper trapezius at this time.  Capillary  refill less than 2 seconds.  SKIN:  Warm and dry without rashes.  NEUROLOGIC:  Alert, oriented.  Sensation intact, motor strength equal and  symmetric. Grip strength/adduction of the fingers  is 5/5.  Cranial nerves  II-XII intact.     CONTINUATION BY BRYAN HENDRICK, PA-C    PAST MEDICAL HISTORY CONTINUED:  The patient denies history of DVTs, PEs, exogenous hormone use, travel,  surgery, splinting, or hemoptysis.  Denies any recent history of long travel.        INITIAL ASSESSMENT AND PLAN:  I do not suspect ACS on this patient.  She has completely reproducible chest  pain.  Nursing staff has already ordered chest pain cardiac panel, which has  resulted at this time.  I do not suspect PE.  The patient is PERC negative  with the exception of the oxygen which is recorded at 94%, although for me in  the room, the patient is never below 95% to 96%.  Importantly, the patient is  also not dyspneic.  The patient has cardiac risk factors with a large negative  cardiac risk factor being a 28 year old, but more importantly for me, the  patient has completely reproducible chest pain.  I suspect the patient had a  brief cervical radiculopathy that caused her to clench down, causing muscle  discomfort to the chest.    DIAGNOSTIC STUDIES:  CBC showed hemoglobin 12.6, otherwise normal.  BMP showed potassium of 3.4,  otherwise normal.  Two-view chest x-ray negative per Radiology.  Troponin  undetectable.  A 12-lead EKG was done.  Dr. Orma Flamingobert Trace Wirick did not see any  acute S-T segment or T-wave abnormalities that are consistent with acute  ischemia or infarction.    COURSE:  The patient was stable in the Emergency Department.  Did not develop other  symptoms.  Informed of results and discharged.    FINAL DIAGNOSIS:  Chest pain, unspecified.    DISPOSITION AND TREATMENT PLAN:  Discharge instructions given.  The patient is discharged home in stable  condition, with instructions to follow up with their regular doctor.  They are  advised to return immediately for any worsening or symptoms of concern.    The patient was personally evaluated by myself and Dr. Ronne Binningob Jamine Wingate, who  agrees with the above assessment and plan.         CONTINUATION BY Orma FlamingOBERT Duanne Duchesne, MD     INITIAL ASSESSMENT AND MANAGEMENT PLAN:  This is a new problem for this patient.    DIAGNOSTIC STUDIES:  EKG shows nonspecific ST changes, but I see no acute ischemia.  Chest x-ray is  read as normal by Radiology.  Troponin normal.  Basic metabolic panel:  Potassium 3.4, otherwise normal.  CBC normal.    EMERGENCY DEPARTMENT COURSE:  The patient has been totally stable through his stay in the Emergency  Department.  On my examination, chest pain is entirely reproducible with  palpation of her anterior chest, particularly at the margins of the ribs and  sternum.  The patient is PERC rule negative.  I have checked her  legs and  there is no swelling, and she has no signs of DVT.  She has got no previous  thromboembolic disease and she is not on any hormones.  I think this is very  unlikely to be a pulmonary embolism.  In addition, I do not think this is  coronary syndrome due to her relatively young age and no significant risk  factors, and the total reproducibility of the pain.    At this point, I think this is most likely costochondritis due to the stuff  she is doing at work, lifting heavy objects and such.  I believe she is safe  to [facility]    CLINICAL IMPRESSION AND DIAGNOSIS:  Chest pain evaluation.    DISPOSITION AND PLAN:  Discharged in stable condition to [facility].  She is  advised to call them on Monday for followup and come back for worsening pain,  difficulty breathing, or any other concerns.  A troponin was ordered by the  nurses as part of the protocol, but we will not pursue this any further.  I do  not think that she requires repeat troponin.          ___________________  Posey Prontoobert E Evgenia Merriman MD  Dictated By: Mearl LatinBryan P. Lockie ParesHendrick, PA-C    My signature above authenticates this document and my orders, the final  diagnosis (es), discharge prescription (s), and instructions in the [facility]  Pulsecheck record.   Nursing notes have been reviewed by the physician/mid-level provider.    If you have any questions please contact [phone].    MC  D:02/23/2014 12:33:57  T: 02/23/2014 09:81:1923:06:24  [id number]  Electronically Authenticated by:  Posey Prontoobert E. Suttyn Cryder, M.D. On 03/03/2014 10:24 PM EST

## 2014-03-30 LAB — POC CHEM8
BUN: 15 mg/dl (ref 7–25)
CALCIUM,IONIZED: 4.9 mg/dL (ref 4.40–5.40)
CO2, TOTAL: 28 mmol/L (ref 21–32)
Chloride: 100 mEq/L (ref 98–107)
Creatinine: 0.8 mg/dl (ref 0.6–1.3)
Glucose: 92 mg/dL (ref 74–106)
HCT: 41 % (ref 38–45)
HGB: 13.9 gm/dl (ref 13.0–17.2)
Potassium: 4.4 mEq/L (ref 3.5–4.9)
Sodium: 139 mEq/L (ref 136–145)

## 2014-03-31 NOTE — ED Provider Notes (Addendum)
[facility]  EMERGENCY [facility] TREATMENT REPORT  NAME:  [name] [name], [name] [name]  SEX:   F  ADMIT: 03/30/2014  DOB:   Dec 05, 1985  MR#    [id number]  ROOM:    TIME DICTATED: 01 10 PM  ACCT#  [id number]        CHIEF COMPLAINT:  Hyperglycemia.    HISTORY OF PRESENT ILLNESS:  The patient is a 28 year old female who reports that she had hyperglycemia  last night as high as 380.  She took her insulin last night and this morning.  She has been having intermittent blurry vision, pins and needles in her hands  and feet.  [facility].  They have attributed  these symptoms to neuropathy.  She states that today she had a sharp pain in  her right trapezius/rhomboid with shooting pains into her right arm.  She  states that it was so painful that she could not move her right arm or lift it  over her head.  She was concerned that this is all related to her  hyperglycemia that she needed treatment, so she came into the Emergency  Department today.    REVIEW OF SYSTEMS:  She denies any chest pain, shortness breath.  She denies any abdominal pain,  dysuria, urgency, frequency, flank pain.  She denies any current numbness or  tingling, but she does have shooting pain into her right arm and hand.    REVIEW OF SYSTEMS:  CONSTITUTIONAL:  No fever, chills, malaise.    ENT:  No sore throat, runny nose or other URI symptoms.   RESPIRATORY:  No cough, shortness of breath or wheezing.    CARDIOVASCULAR:  No chest pain, chest pressure or palpitations.    GASTROINTESTINAL:  No vomiting, diarrhea or abdominal pain.   GENITOURINARY:  No dysuria, frequency or urgency.     MUSCULOSKELETAL:  See HPI.  SKIN:  No rashes, abrasions, ecchymosis.  NEUROLOGIC:  See HPI.  Denies complaints in all other systems.    PAST MEDICAL HISTORY:  Diabetes.    SOCIAL HISTORY:  Tobacco dependent, drinks socially.    ALLERGIES:  NO KNOWN DRUG ALLERGIES.    MEDICATIONS:  Insulin.    PHYSICAL EXAMINATION:   VITAL SIGNS:  123/71, pulse 81, respirations 18, temperature 97.8, pain is 8  out of 10, O2 saturations were 98% on room air.  GENERAL APPEARANCE:  The patient is a healthy appearing 28 year old female.  She is in no acute distress.  HEENT:  Head is symmetric, atraumatic.  CERVICAL SPINE:  She has no midline tenderness, forward flexion to her chin,  rotation to 90 degrees bilaterally with minimal pain.  CARDIOVASCULAR:  Regular rate and rhythm.  RESPIRATORY:  Lungs are clear to auscultation.  MUSCULOSKELETAL:  She has a right rhomboid spasm.  I am able to reproduce her  pain.  Today, she had an episode where she was unable to lift her arm, now in  the ED she has no weakness, she is able to lift her arm and states that the  pain lasted about 5 to 10 minutes.  NEUROLOGIC:  She has 5/5 muscle strength in biceps, triceps, brachioradialis,  deltoids, no loss of sensation.  She also has 5/5 muscle strength in her lower  extremities.  She is alert.  She is oriented.  No facial asymmetry or  dysarthria.  SKIN:  Clean, dry and intact.    EMERGENCY DEPARTMENT COURSE:  The patient was seen and examined.  She  did not have hyperglycemia.  Her exam  was fairly benign.  She is not having any chest pain, shortness of breath or  additional muscle spasm.  She did not have any electrolyte imbalances.  She  has a history of neuropathy, I think this is just a rhomboid spasm from  working.  We discussed the diagnosis and plan for followup with the patient.    DIAGNOSES:    1.  Muscle spasm.  2.  Diabetes type 2, controlled.    DISPOSITION:  Discharged to home in stable condition.  The patient was personally evaluated  by myself and Dr. Floyce Stakesodd Kei Langhorst who agrees with the above assessment and plan.      ___________________  Johny Drillingodd A Dezire Turk MD  Dictated By: Crecencio McHarmony J. Madden, PA-C    My signature above authenticates this document and my orders, the final  diagnosis (es), discharge prescription (s), and instructions in the [facility]   Pulsecheck record.  Nursing notes have been reviewed by the physician/mid-level provider.    If you have any questions please contact [phone].    [facility]  D:03/30/2014 13:10:28  T: 03/31/2014 09:00:16  [id number]  Electronically Authenticated by:  Johny Drillingodd A. Asharia Lotter, M.D. On 04/01/2014 09:46 AM EST

## 2017-01-14 ENCOUNTER — Emergency Department (HOSPITAL_COMMUNITY): Payer: Self-pay

## 2017-01-14 ENCOUNTER — Encounter (HOSPITAL_COMMUNITY): Payer: Self-pay

## 2017-01-14 ENCOUNTER — Emergency Department (HOSPITAL_COMMUNITY)
Admission: EM | Admit: 2017-01-14 | Discharge: 2017-01-14 | Disposition: A | Discharge status: Home / Self Care | Payer: Self-pay | Attending: Physician Assistant | Admitting: Physician Assistant

## 2017-01-14 DIAGNOSIS — M79641 Pain in right hand: Secondary | ICD-10-CM | POA: Insufficient documentation

## 2017-01-14 DIAGNOSIS — R202 Paresthesia of skin: Secondary | ICD-10-CM | POA: Insufficient documentation

## 2017-01-14 DIAGNOSIS — M4722 Other spondylosis with radiculopathy, cervical region: Secondary | ICD-10-CM | POA: Insufficient documentation

## 2017-01-14 DIAGNOSIS — Z87891 Personal history of nicotine dependence: Secondary | ICD-10-CM | POA: Insufficient documentation

## 2017-01-14 DIAGNOSIS — M62838 Other muscle spasm: Secondary | ICD-10-CM | POA: Insufficient documentation

## 2017-01-14 HISTORY — DX: Type 2 diabetes mellitus without complications: E11.9

## 2017-01-14 LAB — CBC
HEMATOCRIT: 40.7 % (ref 36.0–46.0)
Hemoglobin: 13.6 g/dL (ref 12.0–15.0)
MCH: 29.8 pg (ref 26.0–34.0)
MCHC: 33.4 g/dL (ref 30.0–36.0)
MCV: 89.3 fL (ref 78.0–100.0)
PLATELETS: 398 10*3/uL (ref 150–400)
RBC: 4.56 MIL/uL (ref 3.87–5.11)
RDW: 13.6 % (ref 11.5–15.5)
WBC: 11 10*3/uL — AB (ref 4.0–10.5)

## 2017-01-14 LAB — BASIC METABOLIC PANEL
Anion gap: 7 (ref 5–15)
BUN: 11 mg/dL (ref 6–20)
CALCIUM: 9.3 mg/dL (ref 8.9–10.3)
CO2: 25 mmol/L (ref 22–32)
CREATININE: 0.79 mg/dL (ref 0.44–1.00)
Chloride: 103 mmol/L (ref 101–111)
GFR calc Af Amer: >60 mL/min (ref >60)
GLUCOSE: 110 mg/dL — AB (ref 65–99)
POTASSIUM: 3.9 mmol/L (ref 3.5–5.1)
SODIUM: 135 mmol/L (ref 135–145)

## 2017-01-14 LAB — CBG MONITORING, ED: Glucose-Capillary: 125 mg/dL — ABNORMAL HIGH (ref 65–99)

## 2017-01-14 LAB — URINALYSIS, ROUTINE W REFLEX MICROSCOPIC
BILIRUBIN URINE: NEGATIVE
Glucose, UA: NEGATIVE mg/dL
Hgb urine dipstick: NEGATIVE
KETONES UR: 5 mg/dL — AB
LEUKOCYTES UA: NEGATIVE
NITRITE: NEGATIVE
PH: 6 (ref 5.0–8.0)
PROTEIN: NEGATIVE mg/dL
Specific Gravity, Urine: 1.019 (ref 1.005–1.030)

## 2017-01-14 LAB — PREGNANCY, URINE: Preg Test, Ur: NEGATIVE

## 2017-01-14 LAB — SEDIMENTATION RATE: Sed Rate: 30 mm/hr — ABNORMAL HIGH (ref 0–22)

## 2017-01-14 MED ORDER — CYCLOBENZAPRINE HCL 10 MG PO TABS
10.0000 mg | ORAL_TABLET | Freq: Once | ORAL | Status: AC
  Administered 2017-01-14: 10 mg via ORAL
  Filled 2017-01-14: qty 1

## 2017-01-14 MED ORDER — KETOROLAC TROMETHAMINE 30 MG/ML IJ SOLN
30.0000 mg | Freq: Once | INTRAMUSCULAR | Status: AC
  Administered 2017-01-14: 30 mg via INTRAMUSCULAR
  Filled 2017-01-14: qty 1

## 2017-01-14 MED ORDER — CYCLOBENZAPRINE HCL 10 MG PO TABS: 10.0000 mg | ORAL_TABLET | Freq: Three times a day (TID) | ORAL | 0 refills | Status: AC | PRN

## 2017-01-14 MED ORDER — MELOXICAM 15 MG PO TABS: 15.0000 mg | ORAL_TABLET | Freq: Every day | ORAL | 0 refills | Status: AC

## 2017-01-14 NOTE — ED Triage Notes (Signed)
Per Pt, Pt is coming from work with complaints of right sided numbness secondary to her high blood sugar this morning. Reports this happening in her right hand for the past month intermittently.

## 2017-01-14 NOTE — Discharge Instructions (Signed)
Use mobic daily as directed, don't use additional NSAIDs like ibuprofen/aleve/etc while taking mobic. Use additional tylenol as needed for additional pain relief. Use flexeril as directed as needed for muscle relaxation/spasms. Do not drive or operate machinery with muscle relaxant use. Use heat to areas of soreness, no more than 20 minutes at a time every hour. Use your wrist brace at night to help with symptoms. Continue taking all of your usual home medications, including the gabapentin that you were previously prescribed. Follow up in 1-2 weeks with your primary care physician and with the orthopedist you were previously referred to, for recheck of ongoing symptoms and ongoing management of your complaints. Return to ER for emergent changing or worsening of symptoms.

## 2017-01-14 NOTE — ED Provider Notes (Signed)
MSE was initiated and I personally evaluated the patient and placed orders (if any) at  12:40 PM on January 14, 2017.  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  Right arm is held neutral resting on her right thigh.  Unusual posturing of the fingers of the right hand, with increased muscle tone diffusely in the right arm.  I am able to mobilize her right arm hand and wrist, with gentle touch and movement.  She states that the entire right arm is numb, and she cannot feel my touch.  There is no weakness of the right arm.  There is moderate muscle tension, located around the right shoulder girdle, towards the neck.  There is tenderness of the right neck.  There is no dysarthria, aphasia, focal weakness or other noted areas of paresthesia.  The patient appears stable so that the remainder of the MSE may be completed by another provider.   Mancel Bale, MD 01/14/17 [phone]

## 2017-01-14 NOTE — ED Provider Notes (Signed)
[place] DEPT Provider Note   CSN: [id number] Arrival date & time: 01/14/17  1206     History   Chief Complaint Chief Complaint  Patient presents with  . Hyperglycemia  . Numbness    HPI Jacqueline Gardner is a 31 y.o. R-handed female with a PMHx of DM2, who presents to the ED with complaints of one month of right upper extremity paresthesias and intermittent right hand pain. Per chart review, pt has been seen recently at [facility] ER for same complaints twice, once on 12/22/16 at which time she had a reassuring CBC/BMP and neg Upreg, was advised that this was likely either diabetic neuropathy or carpal tunnel, started on gabapentin 321m TID and given carpal tunnel brace and advised to f/up with Dr. WRedmond Pullingfor management of carpal tunnel if gabapentin didn't help; the other time was on 12/25/16 at which time she had an xray of her R hand which was negative so she was sent home with instructions to use carpal tunnel brace and f/up with ortho for possible nerve conduction study. Patient states that she has been compliant with the gabapentin and using the wrist splint at night however this has not helped, but she has not yet followed up with orthopedist because of financial reasons. She came in today because she didn't know what else to do to help her symptoms. She describes the pain as 10/10 intermittent throbbing right hand pain that radiates up her forearm into her trapezius on the right side, with no known aggravating factors, and unrelieved with Motrin 800 mg, gabapentin, and splint use at night. 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 She admits that she has had paresthesias in her feet in the past however she is not concerned with this and does not feel the two are related, states that's been going on for a while. She states that she is compliant with her diabetes regimen and that her blood sugars have been running 120s-150s at home. She is  dieting and exercising to try to manage her diabetes better. [facility]   She denies any injuries or falls. She denies any fevers, chills, CP, SOB, abd pain, N/V/D/C, hematuria, dysuria, joint swelling, neck pain, numbness/tingling elsewhere in her body, focal weakness, or any other complaints at this time.    The history is provided by the patient and medical records. No language interpreter was used.  Hand Pain  This is a chronic problem. The current episode started more than 1 week ago. The problem occurs constantly. The problem has not changed since onset.Pertinent negatives include no chest pain, no abdominal pain and no shortness of breath. Nothing aggravates the symptoms. Nothing relieves the symptoms. Treatments tried: motrin 8056m gabapentin 30062mID, and carpal tunnel brace. The treatment provided no relief.    Past Medical History:  Diagnosis Date  . Diabetes mellitus without complication ([place]   There are no active problems to display for this patient.   History reviewed. No pertinent surgical history.  OB History    No data available       Home Medications    Prior to Admission medications   Not on File    Family History No family history on file.  Social History Social History  Substance Use Topics  . 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 Alcohol use No  Allergies   Benadryl [diphenhydramine hcl (sleep)]   Review of Systems Review of Systems  Constitutional: Negative for chills and fever.  Respiratory: Negative for shortness of breath.   Cardiovascular: Negative for chest pain.  Gastrointestinal: Negative for abdominal pain, constipation, diarrhea, nausea and vomiting.  Genitourinary: Negative for dysuria and hematuria.  Musculoskeletal: Positive for arthralgias and myalgias. Negative for joint swelling and neck pain.  Skin: Negative for color change.    Allergic/Immunologic: Positive for immunocompromised state (DM2).  Neurological: Positive for numbness. Negative for weakness.  Psychiatric/Behavioral: Negative for confusion.   All other systems reviewed and are negative for acute change except as noted in the HPI.    Physical Exam Updated Vital Signs BP 135/86 (BP Location: Left Arm)   Pulse 97   Temp 98 F (36.7 C) (Oral)   Resp 16   Ht _0  (1.575 m)   Wt 122.5 kg (270 lb)   LMP 12/24/2016   SpO2 99%   BMI 49.38 kg/m   Physical Exam  Constitutional: She is oriented to person, place, and time. Vital signs are normal. She appears well-developed and well-nourished.  Non-toxic appearance. No distress.  Afebrile, nontoxic, NAD  HENT:  Head: Normocephalic and atraumatic.  Mouth/Throat: Oropharynx is clear and moist and mucous membranes are normal.  Eyes: Conjunctivae and EOM are normal. Right eye exhibits no discharge. Left eye exhibits no discharge.  Neck: Normal range of motion. Neck supple. Muscular tenderness present. No spinous process tenderness present. No neck rigidity. Normal range of motion present.    FROM intact without spinous process TTP, no bony stepoffs or deformities, with mild R sided trapezius/paraspinous muscle TTP and muscle spasms. No rigidity or meningeal signs. No bruising or swelling.   Cardiovascular: Normal rate, regular rhythm, normal heart sounds and intact distal pulses.  Exam reveals no gallop and no friction rub.   No murmur heard. Pulmonary/Chest: Effort normal and breath sounds normal. No respiratory distress. She has no decreased breath sounds. She has no wheezes. She has no rhonchi. She has no rales.  Abdominal: Soft. Normal appearance and bowel sounds are normal. She exhibits no distension. There is no tenderness. There is no rigidity, no rebound, no guarding, no CVA tenderness, no tenderness at McBurney's point and negative Murphy's sign.  Musculoskeletal: Normal range of motion.       Right  hand: She exhibits tenderness. She exhibits normal range of motion, no bony tenderness, normal capillary refill, no deformity, no laceration and no swelling. Decreased sensation noted. Normal strength noted.  R arm with FROM intact at all joints and in all digits, mild tenderness to trapezius with palpable spasm, and mild tenderness diffusely across hand but no focal bony tenderness to entire extremity and hand, no crepitus or deformity, no swelling, no skin changes, strength intact in all aspects of the RUE movements. Sensation subjectively diminished in R hand without focal area but mostly on the thumb side and less on the pinky side of the hand. Sensation grossly intact in remainder of extremity. Distal pulses intact, soft compartments. Neg phalen's and tinel's sign.   Neurological: She is alert and oriented to person, place, and time. She has normal strength. No sensory deficit.  Skin: Skin is warm, dry and intact. No rash noted.  Psychiatric: She has a normal mood and affect.  Nursing note and vitals reviewed.    ED Treatments / Results  Labs (all labs ordered are listed, but only abnormal results are displayed) Labs Reviewed  BASIC METABOLIC PANEL -  Abnormal; Notable for the following:       Result Value   Glucose, Bld 110 (*)    All other components within normal limits  CBC - Abnormal; Notable for the following:    WBC 11.0 (*)    All other components within normal limits  URINALYSIS, ROUTINE W REFLEX MICROSCOPIC - Abnormal; Notable for the following:    APPearance HAZY (*)    Ketones, ur 5 (*)    All other components within normal limits  SEDIMENTATION RATE - Abnormal; Notable for the following:    Sed Rate 30 (*)    All other components within normal limits  CBG MONITORING, ED - Abnormal; Notable for the following:    Glucose-Capillary 125 (*)    All other components within normal limits  PREGNANCY, URINE  POC URINE PREG, ED    EKG  EKG Interpretation None        Radiology Ct Cervical Spine Wo Contrast  Result Date: 01/14/2017 CLINICAL DATA:  Neck pain, no known injury, initial encounter EXAM: CT CERVICAL SPINE WITHOUT CONTRAST TECHNIQUE: Multidetector CT imaging of the cervical spine was performed without intravenous contrast. Multiplanar CT image reconstructions were also generated. COMPARISON:  None. FINDINGS: Alignment: Mild straightening of the normal cervical lordosis is noted likely related to muscular spasm. Skull base and vertebrae: 7 cervical segments are well visualized. Vertebral body height is well maintained. Osteophytic changes are noted anteriorly at C5-6 and C6-7. No acute fracture or acute facet abnormality is noted. Soft tissues and spinal canal: No acute soft tissue abnormality is noted. Upper chest: Within normal limits. Other: None IMPRESSION: Mild degenerative change without acute abnormality. Electronically Signed   By: Inez Catalina M.D.   On: 01/14/2017 13:05    R hand Xray at [facility] 12/25/16: IMPRESSION: No comparisons. No fracture, dislocation or focal soft tissue swelling. No significant arthritic change.    Electronically Signed by: Lew Dawes   Procedures Procedures (including critical care time)  Medications Ordered in ED Medications  ketorolac (TORADOL) 30 MG/ML injection 30 mg (30 mg Intramuscular Given 01/14/17 1741)  cyclobenzaprine (FLEXERIL) tablet 10 mg (10 mg Oral Given 01/14/17 1740)     Initial Impression / Assessment and Plan / ED Course  I have reviewed the triage vital signs and the nursing notes.  Pertinent labs & imaging results that were available during my care of the patient were reviewed by me and considered in my medical decision making (see chart for details).     31 y.o. female here with 1 month of RUE paresthesias and R hand pain. Has been seen twice at [facility] ED, given carpal tunnel brace, gabapentin for presumed diabetic neuropathy, and advised to f/up with ortho for possible  nerve conduction study however she hasn't followed up yet. On exam, mild tenderness and spasm to R trapezius, no c-spine spinous process TTP, mild tenderness diffusely along hand without focal bony tenderness, no wrist tenderness, neg phalan's and tinel's sign, subjective diminished sensation to entire hand but seems to have gross sensation intact, and sensation intact in remainder of arm, strength preserved with all functions of the arm/wrist/hand, distal pulses intact, no swelling and soft compartments. Work up thus far reveals: CT cervical spine showing mild degenerative findings to C5-6 and C6-7 which matches somewhat where she's describing her symptoms; BMP with glucose 110 but otherwise WNL, CBC with marginally elevated WBC at 11.0 but doubt clinical significance, and ESR marginally elevated at 30. Will give toradol and flexeril, and await  U/A and Upreg, but doubt need for further emergent work up at this time. Will reassess shortly.   7:12 PM U/A unremarkable. Upreg neg. Pt feeling much better. Symptoms likely multifactorial, including cervical radiculopathy from mild degenerative findings, as well as diabetic peripheral neuropathy, muscle spasm related radiculopathy/pain, and possibly some component of carpal tunnel. Advised continuation of carpal tunnel brace, continuation of all home meds including gabapentin, will rx mobic and flexeril, advised tylenol use as well, and heat use to the areas of muscle spasm. F/up with PCP in 1-2wks for recheck and ongoing management, may want to consider increasing gabapentin at that time if it's not providing significant improvement; also f/up with orthopedist as previously advised by [facility] providers, may need nerve conduction study if she continues to have paresthesias. I explained the diagnosis and have given explicit precautions to return to the ER including for any other new or worsening symptoms. The patient understands and accepts the medical plan as it's been  dictated and I have answered their questions. Discharge instructions concerning home care and prescriptions have been given. The patient is STABLE and is discharged to home in good condition.    Final Clinical Impressions(s) / ED Diagnoses   Final diagnoses:  Right hand pain  Paresthesia of right arm  Cervical radiculopathy due to degenerative joint disease of spine  Trapezius muscle spasm    New Prescriptions New Prescriptions   CYCLOBENZAPRINE (FLEXERIL) 10 MG TABLET    Take 1 tablet (10 mg total) by mouth 3 (three) times daily as needed for muscle spasms.   MELOXICAM (MOBIC) 15 MG TABLET    Take 1 tablet (15 mg total) by mouth daily. [place], [place], [place] 01/14/17 1912    Macarthur Critchley, MD 01/15/17 1539

## 2018-04-20 IMAGING — CT CT CERVICAL SPINE W/O CM
3 of 4 series · 12 of 33 positions shown, 14 images · non-contrast
Comparison: None.

CLINICAL DATA: Neck pain, no known injury, initial encounter

EXAM:
CT CERVICAL SPINE WITHOUT CONTRAST
TECHNIQUE: Multidetector CT imaging of the cervical spine was performed without
intravenous contrast. Multiplanar CT image reconstructions were also
generated.

[Series 4: c_spine 2.0 st · axial · 0.24mm/px · z∈[-296,-164]mm · 4 of 100 slices shown, 5 images]
[im 17/100  soft-tissue]
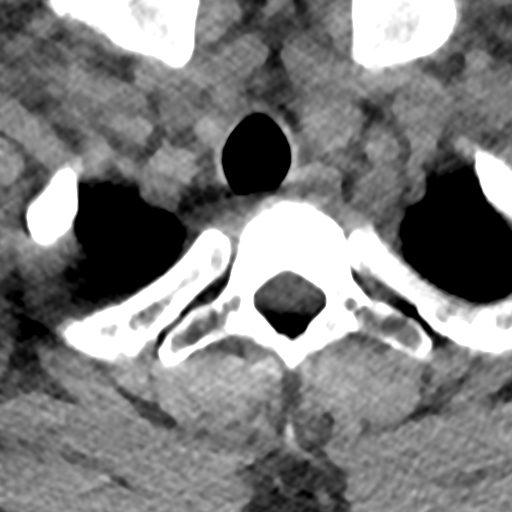
[im 17/100  bone]
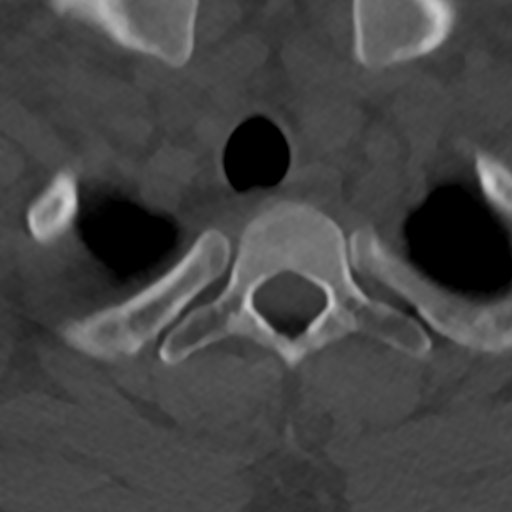
[im 34/100  bone]
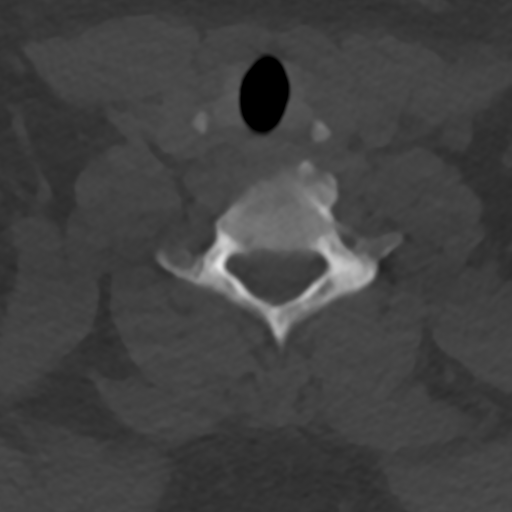
[im 67/100  bone]
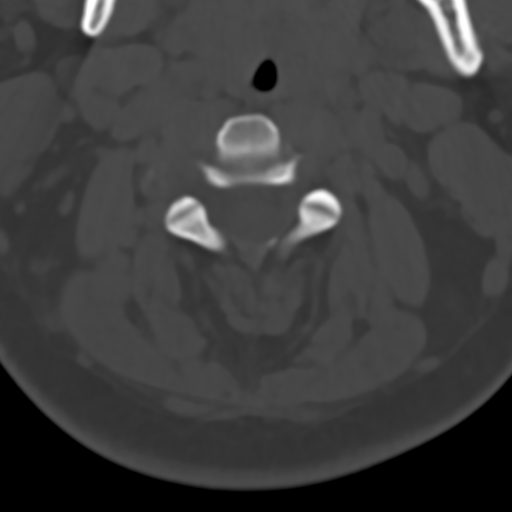
[im 83/100  bone]
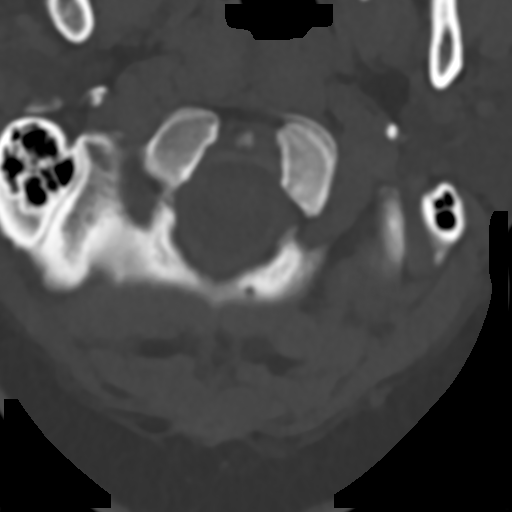

[Series 6: c_spine 2.0 sag bone · sagittal · 0.22mm/px · 5 of 61 slices shown, 6 images]
[im 21/61  bone]
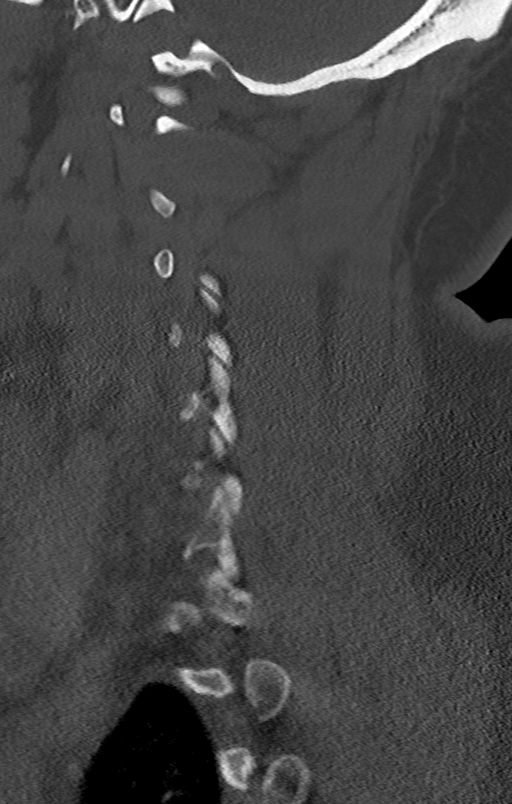
[im 26/61  bone]
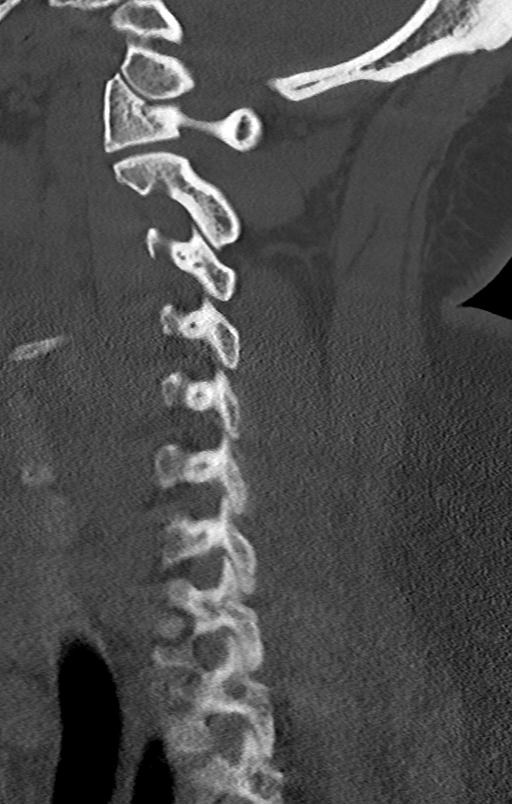
[im 31/61  soft-tissue]
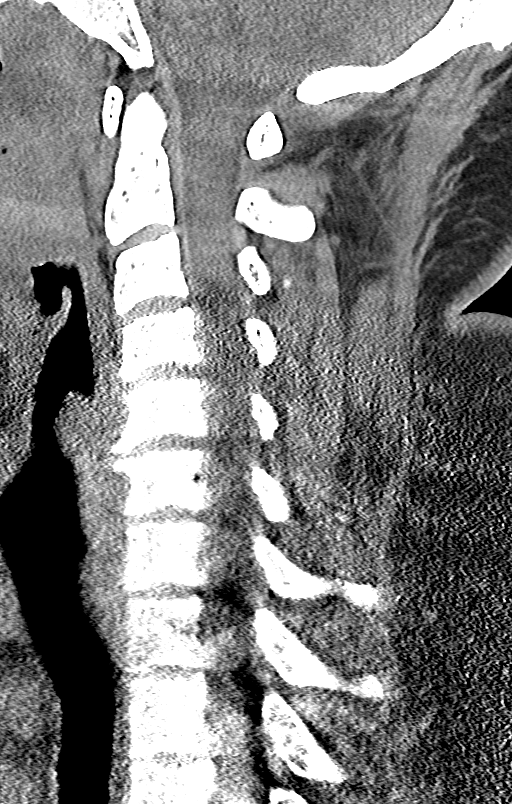
[im 31/61  bone]
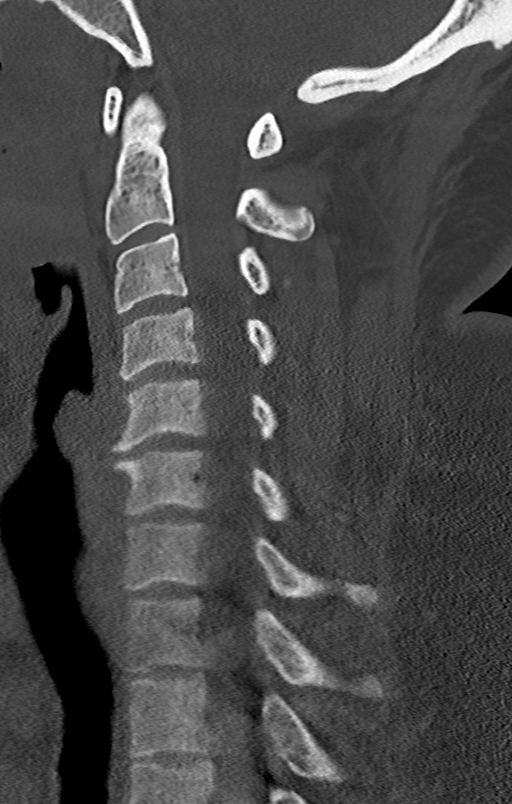
[im 36/61  bone]
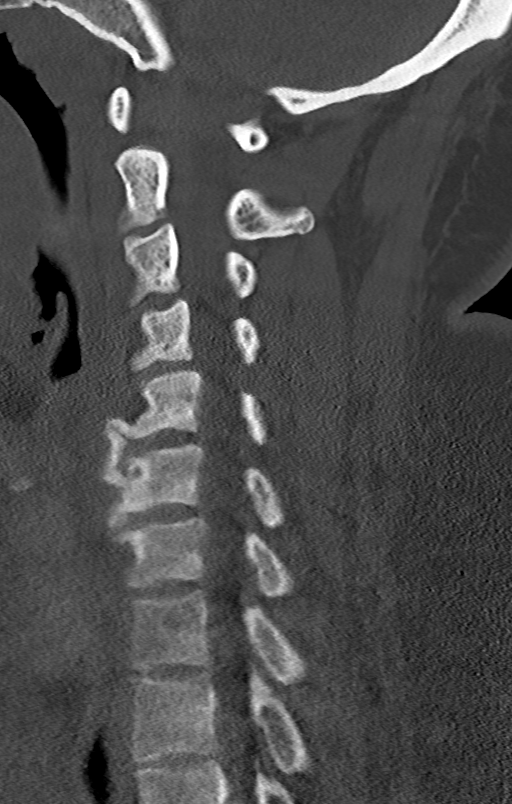
[im 41/61  bone]
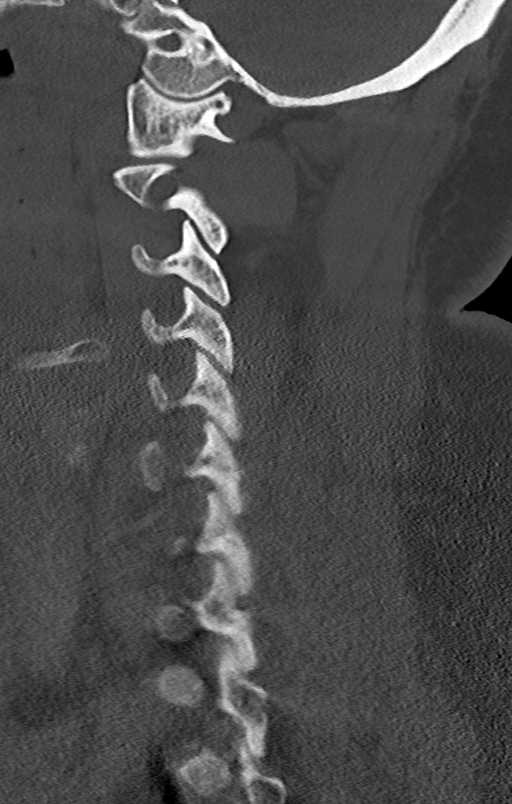

[Series 7: c_spine 2.0 cor bone · coronal · 0.23mm/px · 3 of 61 slices shown]
[im 13/61  bone]
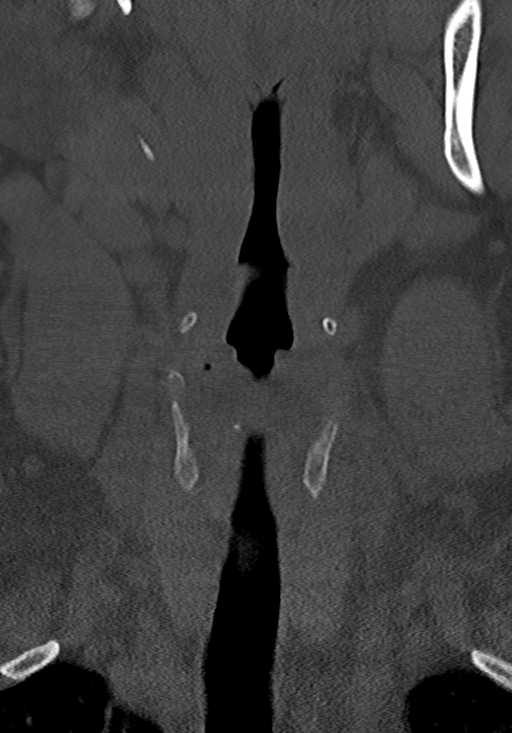
[im 25/61  bone]
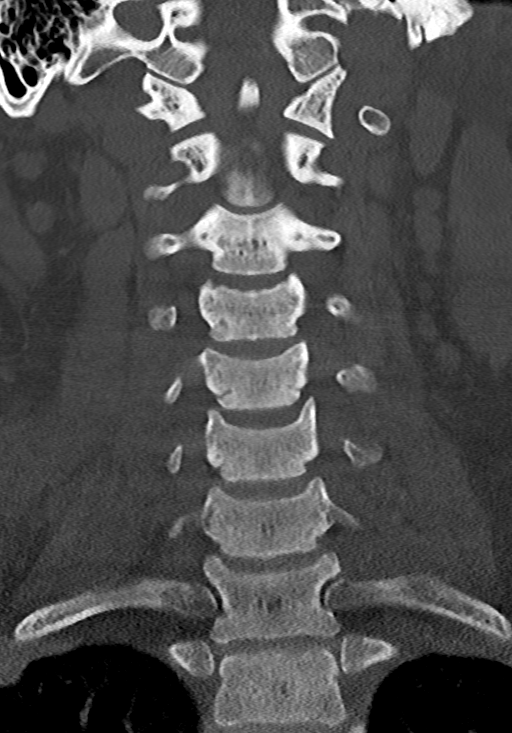
[im 37/61  bone]
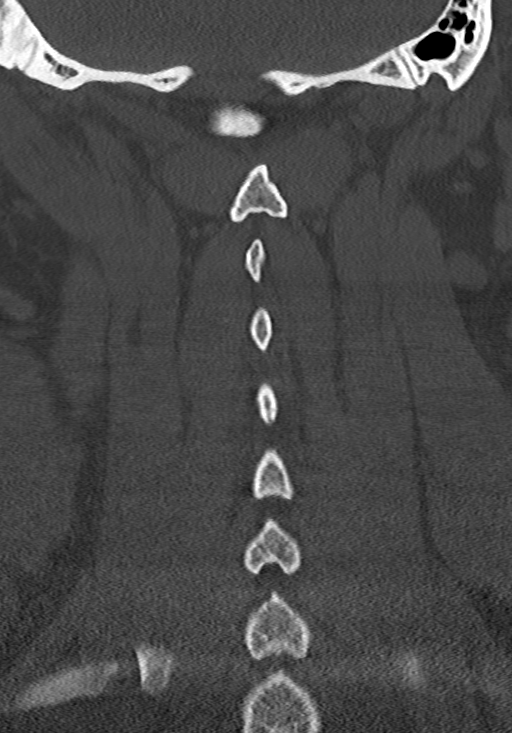

[12 of 33 positions shown; findings below may reference images not displayed]

FINDINGS: Alignment: Mild straightening of the normal cervical lordosis is
noted likely related to muscular spasm.

Skull base and vertebrae: 7 cervical segments are well visualized.
Vertebral body height is well maintained. Osteophytic changes are
noted anteriorly at C5-6 and C6-7. No acute fracture or acute facet
abnormality is noted.

Soft tissues and spinal canal: No acute soft tissue abnormality is
noted.

Upper chest: Within normal limits.

Other: None
IMPRESSION: Mild degenerative change without acute abnormality.
